# Patient Record
Sex: Female | Born: 1955 | Race: White | Hispanic: No | Marital: Single | Smoking: Never smoker
Health system: Southern US, Community
[De-identification: ages and names within clinical notes are randomized; demographics above are authoritative.]

## PROBLEM LIST (undated history)

## (undated) DIAGNOSIS — M79604 Pain in right leg: Secondary | ICD-10-CM

## (undated) DIAGNOSIS — M797 Fibromyalgia: Secondary | ICD-10-CM

## (undated) DIAGNOSIS — M1990 Unspecified osteoarthritis, unspecified site: Secondary | ICD-10-CM

## (undated) DIAGNOSIS — K760 Fatty (change of) liver, not elsewhere classified: Secondary | ICD-10-CM

## (undated) DIAGNOSIS — M5030 Other cervical disc degeneration, unspecified cervical region: Secondary | ICD-10-CM

## (undated) DIAGNOSIS — G4730 Sleep apnea, unspecified: Secondary | ICD-10-CM

## (undated) DIAGNOSIS — F32A Depression, unspecified: Secondary | ICD-10-CM

## (undated) DIAGNOSIS — M79605 Pain in left leg: Secondary | ICD-10-CM

## (undated) DIAGNOSIS — I10 Essential (primary) hypertension: Secondary | ICD-10-CM

## (undated) DIAGNOSIS — C801 Malignant (primary) neoplasm, unspecified: Secondary | ICD-10-CM

## (undated) DIAGNOSIS — E119 Type 2 diabetes mellitus without complications: Secondary | ICD-10-CM

## (undated) DIAGNOSIS — F419 Anxiety disorder, unspecified: Secondary | ICD-10-CM

## (undated) DIAGNOSIS — G2581 Restless legs syndrome: Secondary | ICD-10-CM

## (undated) DIAGNOSIS — K7581 Nonalcoholic steatohepatitis (NASH): Secondary | ICD-10-CM

## (undated) DIAGNOSIS — F329 Major depressive disorder, single episode, unspecified: Secondary | ICD-10-CM

## (undated) DIAGNOSIS — K589 Irritable bowel syndrome without diarrhea: Secondary | ICD-10-CM

## (undated) DIAGNOSIS — K219 Gastro-esophageal reflux disease without esophagitis: Secondary | ICD-10-CM

## (undated) DIAGNOSIS — M5136 Other intervertebral disc degeneration, lumbar region: Secondary | ICD-10-CM

## (undated) HISTORY — PX: JOINT REPLACEMENT: SHX530

## (undated) HISTORY — PX: CHOLECYSTECTOMY: SHX55

## (undated) HISTORY — PX: HEMORRHOID SURGERY: SHX153

## (undated) HISTORY — PX: TUBAL LIGATION: SHX77

---

## 2015-09-03 ENCOUNTER — Encounter
Admission: RE | Admit: 2015-09-03 | Discharge: 2015-09-03 | Disposition: A | Discharge status: Home / Self Care | Payer: BLUE CROSS/BLUE SHIELD | Source: Ambulatory Visit | Attending: Orthopedic Surgery | Admitting: Orthopedic Surgery

## 2015-09-03 DIAGNOSIS — M1611 Unilateral primary osteoarthritis, right hip: Secondary | ICD-10-CM | POA: Insufficient documentation

## 2015-09-03 DIAGNOSIS — Z01818 Encounter for other preprocedural examination: Secondary | ICD-10-CM | POA: Insufficient documentation

## 2015-09-03 HISTORY — DX: Depression, unspecified: F32.A

## 2015-09-03 HISTORY — DX: Irritable bowel syndrome without diarrhea: K58.9

## 2015-09-03 HISTORY — DX: Other intervertebral disc degeneration, lumbar region: M51.36

## 2015-09-03 HISTORY — DX: Other cervical disc degeneration, unspecified cervical region: M50.30

## 2015-09-03 HISTORY — DX: Unspecified osteoarthritis, unspecified site: M19.90

## 2015-09-03 HISTORY — DX: Restless legs syndrome: G25.81

## 2015-09-03 HISTORY — DX: Anxiety disorder, unspecified: F41.9

## 2015-09-03 HISTORY — DX: Essential (primary) hypertension: I10

## 2015-09-03 HISTORY — DX: Malignant (primary) neoplasm, unspecified: C80.1

## 2015-09-03 HISTORY — DX: Fatty (change of) liver, not elsewhere classified: K76.0

## 2015-09-03 HISTORY — DX: Major depressive disorder, single episode, unspecified: F32.9

## 2015-09-03 HISTORY — DX: Nonalcoholic steatohepatitis (NASH): K75.81

## 2015-09-03 HISTORY — DX: Sleep apnea, unspecified: G47.30

## 2015-09-03 HISTORY — DX: Pain in left leg: M79.605

## 2015-09-03 HISTORY — DX: Type 2 diabetes mellitus without complications: E11.9

## 2015-09-03 HISTORY — DX: Gastro-esophageal reflux disease without esophagitis: K21.9

## 2015-09-03 HISTORY — DX: Fibromyalgia: M79.7

## 2015-09-03 HISTORY — DX: Pain in right leg: M79.604

## 2015-09-03 LAB — APTT: APTT: 24 s (ref 24–36)

## 2015-09-03 LAB — CBC
HCT: 38.2 % (ref 35.0–47.0)
Hemoglobin: 13 g/dL (ref 12.0–16.0)
MCH: 30.4 pg (ref 26.0–34.0)
MCHC: 34 g/dL (ref 32.0–36.0)
MCV: 89.5 fL (ref 80.0–100.0)
PLATELETS: 205 10*3/uL (ref 150–440)
RBC: 4.27 MIL/uL (ref 3.80–5.20)
RDW: 13.6 % (ref 11.5–14.5)
WBC: 9.6 10*3/uL (ref 3.6–11.0)

## 2015-09-03 LAB — TYPE AND SCREEN
ABO/RH(D): A POS
ANTIBODY SCREEN: NEGATIVE

## 2015-09-03 LAB — URINALYSIS COMPLETE WITH MICROSCOPIC (ARMC ONLY)
GLUCOSE, UA: NEGATIVE mg/dL
HGB URINE DIPSTICK: NEGATIVE
Nitrite: NEGATIVE
PH: 5 (ref 5.0–8.0)
Protein, ur: 30 mg/dL — AB
SPECIFIC GRAVITY, URINE: 1.028 (ref 1.005–1.030)

## 2015-09-03 LAB — BASIC METABOLIC PANEL
ANION GAP: 10 (ref 5–15)
BUN: 19 mg/dL (ref 6–20)
CALCIUM: 9.5 mg/dL (ref 8.9–10.3)
CO2: 28 mmol/L (ref 22–32)
Chloride: 100 mmol/L — ABNORMAL LOW (ref 101–111)
Creatinine, Ser: 1.09 mg/dL — ABNORMAL HIGH (ref 0.44–1.00)
GFR calc Af Amer: >60 mL/min (ref >60)
GFR, EST NON AFRICAN AMERICAN: 54 mL/min — AB (ref >60)
GLUCOSE: 128 mg/dL — AB (ref 65–99)
POTASSIUM: 4.2 mmol/L (ref 3.5–5.1)
SODIUM: 138 mmol/L (ref 135–145)

## 2015-09-03 LAB — SURGICAL PCR SCREEN
MRSA, PCR: NEGATIVE
STAPHYLOCOCCUS AUREUS: POSITIVE — AB

## 2015-09-03 LAB — ABO/RH: ABO/RH(D): A POS

## 2015-09-03 LAB — PROTIME-INR
INR: 0.96
PROTHROMBIN TIME: 13 s (ref 11.4–15.0)

## 2015-09-03 LAB — SEDIMENTATION RATE: SED RATE: 25 mm/h (ref 0–30)

## 2015-09-03 NOTE — Patient Instructions (Signed)
  Your procedure is scheduled on: 09/09/15 Report to Day Surgery. [facility] To find out your arrival time please call [phone] between 1PM - 3PM on 09/08/15 Remember: Instructions that are not followed completely may result in serious medical risk, up to and including death, or upon the discretion of your surgeon and anesthesiologist your surgery may need to be rescheduled.    __X__ 1. Do not eat food or drink liquids after midnight. No gum chewing or hard candies.     __X__ 2. No Alcohol for 24 hours before or after surgery.   ____ 3. Bring all medications with you on the day of surgery if instructed.    _X___ 4. Notify your doctor if there is any change in your medical condition     (cold, fever, infections).     Do not wear jewelry, make-up, hairpins, clips or nail polish.  Do not wear lotions, powders, or perfumes. You may wear deodorant.  Do not shave 48 hours prior to surgery. Men may shave face and neck.  Do not bring valuables to the hospital.    [facility] is not responsible for any belongings or valuables.               Contacts, dentures or bridgework may not be worn into surgery.  Leave your suitcase in the car. After surgery it may be brought to your room.  For patients admitted to the hospital, discharge time is determined by your                treatment team.   Patients discharged the day of surgery will not be allowed to drive home.   Please read over the following fact sheets that you were given:   Surgical Site Infection Prevention   ____ Take these medicines the morning of surgery with A SIP OF WATER:    1. OMEPRAZOLE  2. CYMBALTA  3. METOPROLOL  4.  5.  6.  ____ Fleet Enema (as directed)   __X__ Use CHG Soap as directed  ____ Use inhalers on the day of surgery  __X__ Stop metformin 2 days prior to surgery    ____ Take 1/2 of usual insulin dose the night before surgery and none on the morning of surgery.   ____ Stop  Coumadin/Plavix/aspirin on STOP ASPIRIN 1 WEEK BEFORE SURGERY  __X__ Stop Anti-inflammatories on STOP VOLTAREN 1 WEEK BEFORE SURGERY  ____ Stop supplements until after surgery.    ____ Bring C-Pap to the hospital.

## 2015-09-05 LAB — URINE CULTURE: SPECIAL REQUESTS: NORMAL

## 2015-09-09 ENCOUNTER — Inpatient Hospital Stay: Payer: BLUE CROSS/BLUE SHIELD

## 2015-09-09 ENCOUNTER — Inpatient Hospital Stay
Admission: RE | Admit: 2015-09-09 | Discharge: 2015-09-12 | DRG: 470 | Disposition: A | Discharge status: Home Health | Payer: BLUE CROSS/BLUE SHIELD | Source: Ambulatory Visit | Attending: Orthopedic Surgery | Admitting: Orthopedic Surgery

## 2015-09-09 ENCOUNTER — Inpatient Hospital Stay: Payer: BLUE CROSS/BLUE SHIELD | Admitting: Anesthesiology

## 2015-09-09 ENCOUNTER — Encounter: Payer: Self-pay | Admitting: Orthopedic Surgery

## 2015-09-09 ENCOUNTER — Encounter
Admission: RE | Disposition: A | Discharge status: Home Health | Payer: Self-pay | Source: Ambulatory Visit | Attending: Orthopedic Surgery

## 2015-09-09 DIAGNOSIS — I10 Essential (primary) hypertension: Secondary | ICD-10-CM | POA: Diagnosis present

## 2015-09-09 DIAGNOSIS — G4730 Sleep apnea, unspecified: Secondary | ICD-10-CM | POA: Diagnosis present

## 2015-09-09 DIAGNOSIS — E119 Type 2 diabetes mellitus without complications: Secondary | ICD-10-CM | POA: Diagnosis present

## 2015-09-09 DIAGNOSIS — Z6841 Body Mass Index (BMI) 40.0 and over, adult: Secondary | ICD-10-CM

## 2015-09-09 DIAGNOSIS — Z86010 Personal history of colonic polyps: Secondary | ICD-10-CM | POA: Diagnosis not present

## 2015-09-09 DIAGNOSIS — M259 Joint disorder, unspecified: Secondary | ICD-10-CM

## 2015-09-09 DIAGNOSIS — G2581 Restless legs syndrome: Secondary | ICD-10-CM | POA: Diagnosis present

## 2015-09-09 DIAGNOSIS — K219 Gastro-esophageal reflux disease without esophagitis: Secondary | ICD-10-CM | POA: Diagnosis present

## 2015-09-09 DIAGNOSIS — M5136 Other intervertebral disc degeneration, lumbar region: Secondary | ICD-10-CM | POA: Diagnosis present

## 2015-09-09 DIAGNOSIS — K589 Irritable bowel syndrome without diarrhea: Secondary | ICD-10-CM | POA: Diagnosis present

## 2015-09-09 DIAGNOSIS — M797 Fibromyalgia: Secondary | ICD-10-CM | POA: Diagnosis present

## 2015-09-09 DIAGNOSIS — F329 Major depressive disorder, single episode, unspecified: Secondary | ICD-10-CM | POA: Diagnosis present

## 2015-09-09 DIAGNOSIS — M1611 Unilateral primary osteoarthritis, right hip: Principal | ICD-10-CM | POA: Diagnosis present

## 2015-09-09 DIAGNOSIS — D62 Acute posthemorrhagic anemia: Secondary | ICD-10-CM | POA: Diagnosis not present

## 2015-09-09 DIAGNOSIS — M5030 Other cervical disc degeneration, unspecified cervical region: Secondary | ICD-10-CM | POA: Diagnosis present

## 2015-09-09 DIAGNOSIS — G8918 Other acute postprocedural pain: Secondary | ICD-10-CM

## 2015-09-09 DIAGNOSIS — E669 Obesity, unspecified: Secondary | ICD-10-CM | POA: Diagnosis present

## 2015-09-09 DIAGNOSIS — F419 Anxiety disorder, unspecified: Secondary | ICD-10-CM | POA: Diagnosis present

## 2015-09-09 HISTORY — PX: TOTAL HIP ARTHROPLASTY: SHX124

## 2015-09-09 LAB — GLUCOSE, CAPILLARY
GLUCOSE-CAPILLARY: 144 mg/dL — AB (ref 65–99)
GLUCOSE-CAPILLARY: 138 mg/dL — AB (ref 65–99)
GLUCOSE-CAPILLARY: 156 mg/dL — AB (ref 65–99)
Glucose-Capillary: 123 mg/dL — ABNORMAL HIGH (ref 65–99)

## 2015-09-09 LAB — CBC
HEMATOCRIT: 39.2 % (ref 35.0–47.0)
HEMOGLOBIN: 12.7 g/dL (ref 12.0–16.0)
MCH: 29.7 pg (ref 26.0–34.0)
MCHC: 32.4 g/dL (ref 32.0–36.0)
MCV: 91.6 fL (ref 80.0–100.0)
Platelets: 185 10*3/uL (ref 150–440)
RBC: 4.28 MIL/uL (ref 3.80–5.20)
RDW: 13.7 % (ref 11.5–14.5)
WBC: 14.2 10*3/uL — ABNORMAL HIGH (ref 3.6–11.0)

## 2015-09-09 LAB — CREATININE, SERUM
CREATININE: 0.92 mg/dL (ref 0.44–1.00)
GFR calc Af Amer: >60 mL/min (ref >60)
GFR calc non Af Amer: >60 mL/min (ref >60)

## 2015-09-09 SURGERY — ARTHROPLASTY, HIP, TOTAL, ANTERIOR APPROACH
Anesthesia: Spinal | Laterality: Right

## 2015-09-09 MED ORDER — MENTHOL 3 MG MT LOZG: 1.0000 | LOZENGE | OROMUCOSAL | Status: DC | PRN

## 2015-09-09 MED ORDER — ONDANSETRON HCL 4 MG/2ML IJ SOLN: 4.0000 mg | Freq: Once | INTRAMUSCULAR | Status: DC | PRN

## 2015-09-09 MED ORDER — CEFAZOLIN SODIUM-DEXTROSE 2-3 GM-% IV SOLR
2.0000 g | Freq: Four times a day (QID) | INTRAVENOUS | Status: AC
  Administered 2015-09-09 – 2015-09-10 (×3): 2 g via INTRAVENOUS
  Filled 2015-09-09 (×3): qty 50

## 2015-09-09 MED ORDER — MAGNESIUM HYDROXIDE 400 MG/5ML PO SUSP
30.0000 mL | Freq: Every day | ORAL | Status: DC | PRN
  Administered 2015-09-10 – 2015-09-11 (×3): 30 mL via ORAL
  Filled 2015-09-09 (×4): qty 30

## 2015-09-09 MED ORDER — INSULIN ASPART 100 UNIT/ML ~~LOC~~ SOLN
0.0000 [IU] | Freq: Three times a day (TID) | SUBCUTANEOUS | Status: DC
  Administered 2015-09-09: 2 [IU] via SUBCUTANEOUS
  Administered 2015-09-10 – 2015-09-12 (×8): 3 [IU] via SUBCUTANEOUS
  Filled 2015-09-09 (×5): qty 3
  Filled 2015-09-09: qty 2
  Filled 2015-09-09 (×3): qty 3

## 2015-09-09 MED ORDER — FENTANYL CITRATE (PF) 100 MCG/2ML IJ SOLN: 25.0000 ug | INTRAMUSCULAR | Status: DC | PRN

## 2015-09-09 MED ORDER — LISINOPRIL 20 MG PO TABS
20.0000 mg | ORAL_TABLET | Freq: Every day | ORAL | Status: DC
  Administered 2015-09-10 – 2015-09-12 (×3): 20 mg via ORAL
  Filled 2015-09-09 (×3): qty 1

## 2015-09-09 MED ORDER — MIDAZOLAM HCL 5 MG/5ML IJ SOLN
INTRAMUSCULAR | Status: DC | PRN
  Administered 2015-09-09: 1 mg via INTRAVENOUS

## 2015-09-09 MED ORDER — TRANEXAMIC ACID 1000 MG/10ML IV SOLN
1500.0000 mg | INTRAVENOUS | Status: DC
  Filled 2015-09-09: qty 15

## 2015-09-09 MED ORDER — METHOCARBAMOL 500 MG PO TABS
500.0000 mg | ORAL_TABLET | Freq: Four times a day (QID) | ORAL | Status: DC | PRN
  Administered 2015-09-09 – 2015-09-12 (×2): 500 mg via ORAL
  Filled 2015-09-09 (×2): qty 1

## 2015-09-09 MED ORDER — ONDANSETRON HCL 4 MG/2ML IJ SOLN: 4.0000 mg | Freq: Four times a day (QID) | INTRAMUSCULAR | Status: DC | PRN

## 2015-09-09 MED ORDER — FENTANYL CITRATE (PF) 100 MCG/2ML IJ SOLN
INTRAMUSCULAR | Status: DC | PRN
  Administered 2015-09-09: 50 ug via INTRAVENOUS

## 2015-09-09 MED ORDER — CHLORHEXIDINE GLUCONATE 4 % EX LIQD
60.0000 mL | Freq: Once | CUTANEOUS | Status: DC
Start: 2015-09-09 — End: 2015-09-09

## 2015-09-09 MED ORDER — CEFAZOLIN SODIUM-DEXTROSE 2-3 GM-% IV SOLR
INTRAVENOUS | Status: AC
Start: 2015-09-09 — End: 2015-09-09
  Administered 2015-09-09: 2 g via INTRAVENOUS
  Filled 2015-09-09: qty 50

## 2015-09-09 MED ORDER — OXYCODONE HCL 5 MG PO TABS
5.0000 mg | ORAL_TABLET | ORAL | Status: DC | PRN
  Administered 2015-09-09 (×2): 5 mg via ORAL
  Administered 2015-09-10 (×3): 10 mg via ORAL
  Administered 2015-09-10 – 2015-09-12 (×7): 5 mg via ORAL
  Filled 2015-09-09 (×4): qty 1
  Filled 2015-09-09: qty 2
  Filled 2015-09-09 (×2): qty 1
  Filled 2015-09-09: qty 2
  Filled 2015-09-09 (×3): qty 1
  Filled 2015-09-09: qty 2

## 2015-09-09 MED ORDER — PROPOFOL 500 MG/50ML IV EMUL
INTRAVENOUS | Status: DC | PRN
  Administered 2015-09-09: 75 ug/kg/min via INTRAVENOUS

## 2015-09-09 MED ORDER — BUPIVACAINE-EPINEPHRINE (PF) 0.25% -1:200000 IJ SOLN
INTRAMUSCULAR | Status: AC
  Filled 2015-09-09: qty 30

## 2015-09-09 MED ORDER — PROPOFOL 10 MG/ML IV BOLUS
INTRAVENOUS | Status: DC | PRN
  Administered 2015-09-09: 20 mg via INTRAVENOUS

## 2015-09-09 MED ORDER — KETAMINE HCL 50 MG/ML IJ SOLN
INTRAMUSCULAR | Status: DC | PRN
  Administered 2015-09-09: 50 mg via INTRAMUSCULAR

## 2015-09-09 MED ORDER — PHENYLEPHRINE HCL 10 MG/ML IJ SOLN
INTRAMUSCULAR | Status: DC | PRN
  Administered 2015-09-09 (×2): 100 ug via INTRAVENOUS
  Administered 2015-09-09 (×2): 200 ug via INTRAVENOUS
  Administered 2015-09-09 (×3): 100 ug via INTRAVENOUS
  Administered 2015-09-09: 200 ug via INTRAVENOUS
  Administered 2015-09-09: 100 ug via INTRAVENOUS
  Administered 2015-09-09: 200 ug via INTRAVENOUS
  Administered 2015-09-09: 100 ug via INTRAVENOUS

## 2015-09-09 MED ORDER — DULOXETINE HCL 60 MG PO CPEP
60.0000 mg | ORAL_CAPSULE | Freq: Two times a day (BID) | ORAL | Status: DC
  Administered 2015-09-09 – 2015-09-12 (×7): 60 mg via ORAL
  Filled 2015-09-09 (×7): qty 1

## 2015-09-09 MED ORDER — TRANEXAMIC ACID 1000 MG/10ML IV SOLN
15.0000 mg | INTRAVENOUS | Status: DC | PRN
  Administered 2015-09-09: 1500 mg via INTRAVENOUS

## 2015-09-09 MED ORDER — ALPRAZOLAM 0.5 MG PO TABS
1.0000 mg | ORAL_TABLET | Freq: Three times a day (TID) | ORAL | Status: DC | PRN
  Administered 2015-09-09 – 2015-09-11 (×2): 1 mg via ORAL
  Filled 2015-09-09 (×2): qty 2

## 2015-09-09 MED ORDER — PANTOPRAZOLE SODIUM 40 MG PO TBEC
40.0000 mg | DELAYED_RELEASE_TABLET | Freq: Every day | ORAL | Status: DC
  Administered 2015-09-09 – 2015-09-12 (×4): 40 mg via ORAL
  Filled 2015-09-09 (×4): qty 1

## 2015-09-09 MED ORDER — ENOXAPARIN SODIUM 40 MG/0.4ML ~~LOC~~ SOLN
40.0000 mg | SUBCUTANEOUS | Status: DC
  Administered 2015-09-10 – 2015-09-12 (×3): 40 mg via SUBCUTANEOUS
  Filled 2015-09-09 (×3): qty 0.4

## 2015-09-09 MED ORDER — ASPIRIN 81 MG PO CHEW
81.0000 mg | CHEWABLE_TABLET | Freq: Every day | ORAL | Status: DC
  Administered 2015-09-09 – 2015-09-12 (×4): 81 mg via ORAL
  Filled 2015-09-09 (×5): qty 1

## 2015-09-09 MED ORDER — MORPHINE SULFATE (PF) 2 MG/ML IV SOLN
INTRAVENOUS | Status: AC
  Filled 2015-09-09: qty 1

## 2015-09-09 MED ORDER — DEXTROSE 5 % IV SOLN
500.0000 mg | Freq: Four times a day (QID) | INTRAVENOUS | Status: DC | PRN
  Filled 2015-09-09: qty 5

## 2015-09-09 MED ORDER — METOCLOPRAMIDE HCL 5 MG/ML IJ SOLN: 5.0000 mg | Freq: Three times a day (TID) | INTRAMUSCULAR | Status: DC | PRN

## 2015-09-09 MED ORDER — SODIUM CHLORIDE 0.9 % IJ SOLN
INTRAMUSCULAR | Status: AC
  Filled 2015-09-09: qty 10

## 2015-09-09 MED ORDER — NEOMYCIN-POLYMYXIN B GU 40-200000 IR SOLN
Status: AC
  Filled 2015-09-09: qty 4

## 2015-09-09 MED ORDER — DIPHENHYDRAMINE HCL 12.5 MG/5ML PO ELIX: 12.5000 mg | ORAL_SOLUTION | ORAL | Status: DC | PRN

## 2015-09-09 MED ORDER — DOCUSATE SODIUM 100 MG PO CAPS
100.0000 mg | ORAL_CAPSULE | Freq: Two times a day (BID) | ORAL | Status: DC
  Administered 2015-09-09 – 2015-09-12 (×7): 100 mg via ORAL
  Filled 2015-09-09 (×7): qty 1

## 2015-09-09 MED ORDER — SODIUM CHLORIDE 0.9 % IV SOLN
INTRAVENOUS | Status: DC
  Administered 2015-09-09 – 2015-09-10 (×2): via INTRAVENOUS

## 2015-09-09 MED ORDER — SODIUM CHLORIDE 0.9 % IV SOLN
INTRAVENOUS | Status: DC
  Administered 2015-09-09 (×3): via INTRAVENOUS

## 2015-09-09 MED ORDER — EPHEDRINE SULFATE 50 MG/ML IJ SOLN
INTRAMUSCULAR | Status: AC
  Administered 2015-09-09: 5 mg via INTRAVENOUS
  Filled 2015-09-09: qty 1

## 2015-09-09 MED ORDER — ONDANSETRON HCL 4 MG PO TABS
4.0000 mg | ORAL_TABLET | Freq: Four times a day (QID) | ORAL | Status: DC | PRN
  Administered 2015-09-11: 4 mg via ORAL
  Filled 2015-09-09: qty 1

## 2015-09-09 MED ORDER — LISINOPRIL-HYDROCHLOROTHIAZIDE 20-12.5 MG PO TABS: 1.0000 | ORAL_TABLET | Freq: Every day | ORAL | Status: DC

## 2015-09-09 MED ORDER — MAGNESIUM CITRATE PO SOLN: 1.0000 | Freq: Once | ORAL | Status: DC | PRN

## 2015-09-09 MED ORDER — BUPIVACAINE HCL (PF) 0.5 % IJ SOLN
INTRAMUSCULAR | Status: DC | PRN
  Administered 2015-09-09: 3 mL

## 2015-09-09 MED ORDER — BISACODYL 10 MG RE SUPP
10.0000 mg | Freq: Every day | RECTAL | Status: DC | PRN
  Administered 2015-09-12: 10 mg via RECTAL
  Filled 2015-09-09: qty 1

## 2015-09-09 MED ORDER — METFORMIN HCL 500 MG PO TABS
500.0000 mg | ORAL_TABLET | Freq: Two times a day (BID) | ORAL | Status: DC
  Administered 2015-09-09 – 2015-09-12 (×6): 500 mg via ORAL
  Filled 2015-09-09 (×6): qty 1

## 2015-09-09 MED ORDER — CEFAZOLIN SODIUM-DEXTROSE 2-3 GM-% IV SOLR: 2.0000 g | INTRAVENOUS | Status: DC

## 2015-09-09 MED ORDER — METOPROLOL TARTRATE 50 MG PO TABS
50.0000 mg | ORAL_TABLET | Freq: Every morning | ORAL | Status: DC
  Administered 2015-09-10 – 2015-09-12 (×3): 50 mg via ORAL
  Filled 2015-09-09 (×3): qty 1

## 2015-09-09 MED ORDER — ZOLPIDEM TARTRATE 5 MG PO TABS
5.0000 mg | ORAL_TABLET | Freq: Every evening | ORAL | Status: DC | PRN
  Filled 2015-09-09: qty 1

## 2015-09-09 MED ORDER — PHENOL 1.4 % MT LIQD: 1.0000 | OROMUCOSAL | Status: DC | PRN

## 2015-09-09 MED ORDER — ACETAMINOPHEN 325 MG PO TABS: 650.0000 mg | ORAL_TABLET | Freq: Four times a day (QID) | ORAL | Status: DC | PRN

## 2015-09-09 MED ORDER — EPHEDRINE SULFATE 50 MG/ML IJ SOLN
5.0000 mg | Freq: Once | INTRAMUSCULAR | Status: AC
  Administered 2015-09-09: 5 mg via INTRAVENOUS

## 2015-09-09 MED ORDER — HYDROCHLOROTHIAZIDE 12.5 MG PO CAPS
12.5000 mg | ORAL_CAPSULE | Freq: Every day | ORAL | Status: DC
  Administered 2015-09-09 – 2015-09-12 (×4): 12.5 mg via ORAL
  Filled 2015-09-09 (×4): qty 1

## 2015-09-09 MED ORDER — MORPHINE SULFATE (PF) 2 MG/ML IV SOLN
2.0000 mg | INTRAVENOUS | Status: DC | PRN
  Administered 2015-09-09 – 2015-09-10 (×5): 2 mg via INTRAVENOUS
  Filled 2015-09-09 (×4): qty 1

## 2015-09-09 MED ORDER — SODIUM CHLORIDE 0.9 % IV SOLN
INTRAVENOUS | Status: DC
  Administered 2015-09-09: 11:00:00 via INTRAVENOUS

## 2015-09-09 MED ORDER — BUPIVACAINE-EPINEPHRINE 0.25% -1:200000 IJ SOLN
INTRAMUSCULAR | Status: DC | PRN
  Administered 2015-09-09: 30 mL

## 2015-09-09 MED ORDER — METOCLOPRAMIDE HCL 5 MG PO TABS
5.0000 mg | ORAL_TABLET | Freq: Three times a day (TID) | ORAL | Status: DC | PRN
Start: 2015-09-09 — End: 2015-09-12

## 2015-09-09 MED ORDER — ACETAMINOPHEN 650 MG RE SUPP: 650.0000 mg | Freq: Four times a day (QID) | RECTAL | Status: DC | PRN

## 2015-09-09 SURGICAL SUPPLY — 44 items
BLADE SAW 1/2 (BLADE) ×3 IMPLANT
BNDG COHESIVE 6X5 TAN STRL LF (GAUZE/BANDAGES/DRESSINGS) ×6 IMPLANT
CANISTER SUCT 1200ML W/VALVE (MISCELLANEOUS) ×3 IMPLANT
CAPT HIP TOTAL 3 ×3 IMPLANT
CATH FOL LEG HOLDER (MISCELLANEOUS) ×3 IMPLANT
CATH TRAY METER 16FR LF (MISCELLANEOUS) ×3 IMPLANT
CHLORAPREP W/TINT 26ML (MISCELLANEOUS) ×3 IMPLANT
DRAPE C-ARM XRAY 36X54 (DRAPES) ×3 IMPLANT
DRAPE INCISE IOBAN 66X60 STRL (DRAPES) IMPLANT
DRAPE POUCH INSTRU U-SHP 10X18 (DRAPES) ×3 IMPLANT
DRAPE SHEET LG 3/4 BI-LAMINATE (DRAPES) ×9 IMPLANT
DRAPE TABLE BACK 80X90 (DRAPES) ×3 IMPLANT
ELECT BLADE 6.5 EXT (BLADE) ×3 IMPLANT
GAUZE SPONGE 4X4 12PLY STRL (GAUZE/BANDAGES/DRESSINGS) ×3 IMPLANT
GLOVE BIOGEL PI IND STRL 9 (GLOVE) ×1 IMPLANT
GLOVE BIOGEL PI INDICATOR 9 (GLOVE) ×2
GLOVE SURG ORTHO 9.0 STRL STRW (GLOVE) ×3 IMPLANT
GOWN SPECIALTY ULTRA XL (MISCELLANEOUS) ×3 IMPLANT
GOWN STRL REUS W/ TWL LRG LVL3 (GOWN DISPOSABLE) ×1 IMPLANT
GOWN STRL REUS W/TWL LRG LVL3 (GOWN DISPOSABLE) ×2
HEMOVAC 400CC 10FR (MISCELLANEOUS) ×3 IMPLANT
HOOD PEEL AWAY FACE SHEILD DIS (HOOD) ×3 IMPLANT
KIT PREVENA INCISION MGT20CM45 (CANNISTER) ×3 IMPLANT
MAT BLUE FLOOR 46X72 FLO (MISCELLANEOUS) ×3 IMPLANT
NDL SAFETY 18GX1.5 (NEEDLE) ×3 IMPLANT
NEEDLE SPNL 18GX3.5 QUINCKE PK (NEEDLE) ×3 IMPLANT
NS IRRIG 1000ML POUR BTL (IV SOLUTION) ×3 IMPLANT
PACK HIP COMPR (MISCELLANEOUS) ×3 IMPLANT
SOL PREP PVP 2OZ (MISCELLANEOUS) ×3
SOLUTION PREP PVP 2OZ (MISCELLANEOUS) ×1 IMPLANT
STAPLER SKIN PROX 35W (STAPLE) ×3 IMPLANT
STRAP SAFETY BODY (MISCELLANEOUS) ×3 IMPLANT
SUT DVC 2 QUILL PDO  T11 36X36 (SUTURE) ×2
SUT DVC 2 QUILL PDO T11 36X36 (SUTURE) ×1 IMPLANT
SUT DVC QUILL MONODERM 30X30 (SUTURE) ×3 IMPLANT
SUT ETHIBOND NAB CT1 #1 30IN (SUTURE) ×3 IMPLANT
SUT SILK 0 (SUTURE) ×2
SUT SILK 0 30XBRD TIE 6 (SUTURE) ×1 IMPLANT
SUT VIC AB 1 CT1 36 (SUTURE) ×3 IMPLANT
SYR 20CC LL (SYRINGE) ×3 IMPLANT
SYR 30ML LL (SYRINGE) ×3 IMPLANT
TAPE MICROFOAM 4IN (TAPE) ×3 IMPLANT
TUBE KAMVAC SUCTION (TUBING) ×3 IMPLANT
WATER STERILE IRR 1000ML POUR (IV SOLUTION) ×3 IMPLANT

## 2015-09-09 NOTE — Progress Notes (Signed)
Neuro checks wnl to rle. Pain relief with present regime. Tolerated oob to chair for 2 hrs. Right hip dressing intact  With provena chg. Minimal drainage

## 2015-09-09 NOTE — Anesthesia Procedure Notes (Addendum)
Spinal Patient location during procedure: OR Start time: 09/09/2015 7:20 AM End time: 09/09/2015 7:25 AM Staffing Resident/CRNA: Nelda Marseille Performed by: resident/CRNA  Preanesthetic Checklist Completed: patient identified, site marked, surgical consent, pre-op evaluation, timeout performed, IV checked, risks and benefits discussed and monitors and equipment checked Spinal Block Patient position: sitting Prep: Betadine Patient monitoring: heart rate, continuous pulse ox, blood pressure and cardiac monitor Approach: midline Location: L4-5 Injection technique: single-shot Needle Needle type: Whitacre and Introducer  Needle gauge: 24 G Needle length: 9 cm Assessment Sensory level: T10 Additional Notes Negative paresthesia. Negative blood return. Positive free-flowing CSF. Expiration date of kit checked and confirmed. Patient tolerated procedure well, without complications.    Date/Time: 09/09/2015 8:07 AM Performed by: Nelda Marseille Pre-anesthesia Checklist: Patient identified, Emergency Drugs available, Suction available, Patient being monitored and Timeout performed Patient Re-evaluated:Patient Re-evaluated prior to inductionOxygen Delivery Method: Simple face mask

## 2015-09-09 NOTE — OR Nursing (Signed)
Dr Ronelle Nigh aware of low bp 84 systolic and low u/o of 50 ml throughout case.  500 ml NS bolus and 5 mg Ephendrine given as oredered.

## 2015-09-09 NOTE — Transfer of Care (Signed)
Immediate Anesthesia Transfer of Care Note  Patient: Omelia Blackwater  Procedure(s) Performed: Procedure(s): TOTAL HIP ARTHROPLASTY ANTERIOR APPROACH (Right)  Patient Location: PACU  Anesthesia Type:Spinal  Level of Consciousness: sedated  Airway & Oxygen Therapy: Patient Spontanous Breathing and Patient connected to face mask oxygen  Post-op Assessment: Report given to RN and Post -op Vital signs reviewed and stable  Post vital signs: Reviewed and stable  Last Vitals:  Filed Vitals:   09/09/15 0644  BP: 114/77  Pulse: 73  Temp: 36.8 C  Resp: 16    Complications: No apparent anesthesia complications

## 2015-09-09 NOTE — Care Management Note (Addendum)
Case Management Note  Patient Details  Name: Erin Wiley MRN: [id number] Date of Birth: 11/14/1955  Subjective/Objective:                  Met with patient and one of her daughter's Erin Wiley [phone] (cell), [phone] (work 8a-5p) to discuss discharge planning. Patient received from PACU and PT is currently working with her. Patient will be going to Erin Wiley's address: [address] [place] [address] at discharge. She will need a rolling walker. She would like to use [facility] home health [phone] but they are not in-network with [employer]. [facility] however is in -network and if her insurance approve. s they can start PT on Monday. [facility] referral called to [name] [phone] and faxed demographics to [phone] (fax).   Action/Plan: Patient provided choice for home health agencies. Rolling walker requested from [place].She uses [facility] for Rx [phone]. Lovenox 87m #14 called in [facility] for price and availability.   Expected Discharge Date:                  Expected Discharge Plan:     In-House Referral:     Discharge planning Services  CM Consult  Post Acute Care Choice:  Durable Medical Equipment, Home Health Choice offered to:  Patient, Adult Children  DME Arranged:  Walker rolling DME Agency:     [facility] Status of Service:  In process, will continue to follow  Medicare Important Message Given:    Date Medicare IM Given:    Medicare IM give by:    Date Additional Medicare IM Given:    Additional Medicare Important Message give by:     If discussed at [place] Meetings, dates discussed:    Additional Comments: Lovenox $28.16.   AMarshell Garfinkel RN 09/09/2015, 3:13 PM

## 2015-09-09 NOTE — Anesthesia Preprocedure Evaluation (Signed)
Anesthesia Evaluation  Patient identified by MRN, date of birth, ID band Patient awake    Reviewed: Allergy & Precautions, NPO status , Patient's Chart, lab work & pertinent test results  History of Anesthesia Complications Negative for: history of anesthetic complications  Airway Mallampati: III       Dental  (+) Upper Dentures, Missing   Pulmonary neg pulmonary ROS, sleep apnea ,           Cardiovascular hypertension, Pt. on medications and Pt. on home beta blockers      Neuro/Psych Anxiety Depression    GI/Hepatic GERD  Medicated and Controlled,(+) Hepatitis - (fatty liver)  Endo/Other  diabetes, Type 2  Renal/GU negative Renal ROS     Musculoskeletal  (+) Arthritis , Osteoarthritis,  Fibromyalgia -  Abdominal   Peds  Hematology negative hematology ROS (+)   Anesthesia Other Findings   Reproductive/Obstetrics                             Anesthesia Physical Anesthesia Plan  ASA: III  Anesthesia Plan: Spinal   Post-op Pain Management:    Induction:   Airway Management Planned:   Additional Equipment:   Intra-op Plan:   Post-operative Plan:   Informed Consent: I have reviewed the patients History and Physical, chart, labs and discussed the procedure including the risks, benefits and alternatives for the proposed anesthesia with the patient or authorized representative who has indicated his/her understanding and acceptance.     Plan Discussed with:   Anesthesia Plan Comments:         Anesthesia Quick Evaluation

## 2015-09-09 NOTE — Op Note (Signed)
09/09/2015  10:00 AM  PATIENT:  Erin Wiley  59 y.o. female  PRE-OPERATIVE DIAGNOSIS:  OSTEOARTHRITIS right hip primary osteoarthritis  POST-OPERATIVE DIAGNOSIS:  Same  PROCEDURE:  Procedure(s): TOTAL HIP ARTHROPLASTY ANTERIOR APPROACH (Right)  SURGEON: Laurene Footman, MD  ASSISTANTS: None  ANESTHESIA:   spinal  EBL:  Total I/O In: 1000 [I.V.:1000] Out: 350 [Urine:50; Blood:300]  BLOOD ADMINISTERED:none  DRAINS: none   LOCAL MEDICATIONS USED:  MARCAINE     SPECIMEN:  Source of Specimen:  Right femoral head  DISPOSITION OF SPECIMEN:  PATHOLOGY  COUNTS:  YES  TOURNIQUET:  * No tourniquets in log *  IMPLANTS: Medacta AMIS 5 collared stem, 52 mm  mpact cup DM with liner and S 28 mm head   DICTATION: .Dragon Dictation   The patient was brought to the operating room and after spinal anesthesia was obpatient was placed on the operative table with the ipsilateral foot into the Medacta attachment, contralateral leg on a well-padded table. C-arm was brought in and preop template x-ray taken. After prepping and draping in usual sterile fashion appropriate patient identification and timeout procedures were completed. Anterior approach to the hip was obtained and centered over the greater trochanter and TFL muscle. The subcutaneous tissue was incised hemostasis being achieved by electrocautery. TFL fascia was incised and the muscle retracted laterally deep retractor placed. The lateral femoral circumflex vessels were identified and ligated. The anterior capsule was exposed and a capsulotomy performed. The neck was identified and a femoral neck cut carried out with a saw. The head was removed without difficulty and showed sclerotic femoral head and acetabulum. Reaming was carried out to50 mm and a 52 mm cup trial gave appropriate tightness to the acetabular component a 52 mm mPACT cup was impacted into position. The leg was then externally rotated and ischiofemoral and patellofemoral  releases carried out. The femur was sequentially broached to a si5, si5 stem and S head trials were placed and the final components chosen. The 5 stem was inserted along with a a S 28 mm head an52 mm liner. The hip was reduced and was stable the wound was thoroughly irrigated with a dilute Betadine solution. The deep fascia view. Using a heavy Quill after infiltration of 30 cc of quarter percent Sensorcaine with epinephrine. 2-0 Quill to close the skin with skin st, with probing of wound VAC applied at the end of the case. There was a small fracture of the greater trochanter noted intraoperatively but it was felt to be stable a stable fracture pattern with good soft tissue attachments and so no wiring of this was required  PLAN OF CARE: Admit to inpatient

## 2015-09-09 NOTE — Evaluation (Signed)
Physical Therapy Evaluation Patient Details Name: Erin Wiley MRN: [id number] DOB: 1956/05/26 Today's Date: 09/09/2015   History of Present Illness  Pt underwent R THR anterior approach without reported post-op complications. Evaluation performed on POD#0. Pt reports full independent community ambulation prior to surgery without history of falls. No reported use of assistive device at baseline  Clinical Impression  Pt demonstrates good strength in RLE with bed mobility, transfers, and ambulation. She requires some assist for safe sequencing but gradually improves throughout session. Pt is able to transfer from bed to recliner during evaluation. Pt able to complete all bed exercises as instructed requiring assistance for SLR on RLE. Pt will be appropriate to return home with [facility] PT at discharge. Pt will benefit from skilled PT services to address deficits in strength, balance, and mobility in order to return to full function at home.     Follow Up Recommendations Home health PT;Supervision - Intermittent    Equipment Recommendations  Rolling walker with 5" wheels    Recommendations for Other Services       Precautions / Restrictions Precautions Precautions: Anterior Hip Precaution Booklet Issued: Yes (comment) Restrictions Weight Bearing Restrictions: Yes RLE Weight Bearing: Weight bearing as tolerated      Mobility  Bed Mobility Overal bed mobility: Needs Assistance Bed Mobility: Supine to Sit     Supine to sit: Min assist     General bed mobility comments: Pt requires some assist for righting LEs at EOB and UE support to pull up from supine. Cues for sequencing  Transfers Overall transfer level: Needs assistance Equipment used: Rolling walker (2 wheeled) Transfers: Sit to/from Stand Sit to Stand: Min guard         General transfer comment: Pt requires cues for safe hand placement and proper sequencing. Decreased weight shift to RLE during transfer. Good LLE strength  noted. Pt steady in static standing once upright  Ambulation/Gait Ambulation/Gait assistance: Min guard Ambulation Distance (Feet): 5 Feet Assistive device: Rolling walker (2 wheeled) Gait Pattern/deviations: Step-to pattern;Decreased step length - left;Decreased stance time - right;Antalgic Gait velocity: Decreased Gait velocity interpretation: <1.8 ft/sec, indicative of risk for recurrent falls General Gait Details: Pt ambulates from bed to recliner with cues and instruction for proper sequencing for forward/retro ambulation. Pt improves throughout distance with increased ease of sequencing. Step-to pattern with progressively decreasing reliance on UE for support.  Stairs            Wheelchair Mobility    Modified Rankin (Stroke Patients Only)       Balance Overall balance assessment: Needs assistance   Sitting balance-Leahy Scale: Good       Standing balance-Leahy Scale: Fair                               Pertinent Vitals/Pain Pain Assessment: 0-10 Pain Score: 8  Pain Location: R hip Pain Intervention(s): Monitored during session;Premedicated before session;Repositioned    Home Living Family/patient expects to be discharged to:: Private residence Living Arrangements: Children (Will stay at daughter's home at discharge) Available Help at Discharge: Family Type of Home: House Home Access: Stairs to enter Entrance Stairs-Rails: Can reach both Entrance Stairs-Number of Steps: 3 Home Layout: One level Home Equipment: None (no BSC, no walker, no cane, no wheelchair, no hospital bed)      Prior Function Level of Independence: Independent               Hand Dominance  Dominant Hand: Right    Extremity/Trunk Assessment   Upper Extremity Assessment: Overall WFL for tasks assessed           Lower Extremity Assessment: RLE deficits/detail RLE Deficits / Details: Assist required for SLR. Pt with full SAQ without assist. Full DF/PF        Communication   Communication: No difficulties  Cognition Arousal/Alertness: Awake/alert Behavior During Therapy: WFL for tasks assessed/performed Overall Cognitive Status: Within Functional Limits for tasks assessed                      General Comments      Exercises Total Joint Exercises Ankle Circles/Pumps: Strengthening;Both;15 reps;Supine Quad Sets: Strengthening;Both;15 reps;Supine Gluteal Sets: Strengthening;Both;15 reps;Supine Towel Squeeze: Strengthening;Both;15 reps;Supine Short Arc Quad: Strengthening;Right;10 reps;Supine Hip ABduction/ADduction: Strengthening;Both;10 reps;Supine Straight Leg Raises: Strengthening;Right;10 reps;Supine      Assessment/Plan    PT Assessment Patient needs continued PT services  PT Diagnosis Difficulty walking;Abnormality of gait;Generalized weakness;Acute pain   PT Problem List Decreased strength;Decreased range of motion;Decreased activity tolerance;Decreased balance;Decreased mobility;Decreased knowledge of use of DME;Pain;Obesity  PT Treatment Interventions DME instruction;Gait training;Stair training;Functional mobility training;Therapeutic activities;Therapeutic exercise;Balance training;Neuromuscular re-education;Cognitive remediation;Patient/family education;Manual techniques   PT Goals (Current goals can be found in the Care Plan section) Acute Rehab PT Goals Patient Stated Goal: "I just really need to move." PT Goal Formulation: With patient/family Time For Goal Achievement: 09/23/15 Potential to Achieve Goals: Good    Frequency BID   Barriers to discharge        Co-evaluation               End of Session Equipment Utilized During Treatment: Gait belt Activity Tolerance: Patient tolerated treatment well Patient left: in chair;with call bell/phone within reach;with chair alarm set;with family/visitor present;with SCD's reapplied (RLE elevated)           Time: 2725-3664 PT Time Calculation (min)  (ACUTE ONLY): 28 min   Charges:   PT Evaluation $Initial PT Evaluation Tier I: 1 Procedure PT Treatments $Therapeutic Exercise: 8-22 mins   PT G Codes:       Lyndel Safe Davarious Tumbleson PT, DPT   Ellarie Picking 09/09/2015, 3:32 PM

## 2015-09-10 LAB — GLUCOSE, CAPILLARY
GLUCOSE-CAPILLARY: 165 mg/dL — AB (ref 65–99)
GLUCOSE-CAPILLARY: 194 mg/dL — AB (ref 65–99)
GLUCOSE-CAPILLARY: 176 mg/dL — AB (ref 65–99)
Glucose-Capillary: 176 mg/dL — ABNORMAL HIGH (ref 65–99)

## 2015-09-10 LAB — BASIC METABOLIC PANEL
Anion gap: 9 (ref 5–15)
BUN: 11 mg/dL (ref 6–20)
CALCIUM: 8.6 mg/dL — AB (ref 8.9–10.3)
CHLORIDE: 98 mmol/L — AB (ref 101–111)
CO2: 26 mmol/L (ref 22–32)
CREATININE: 0.75 mg/dL (ref 0.44–1.00)
GFR calc Af Amer: >60 mL/min (ref >60)
GFR calc non Af Amer: >60 mL/min (ref >60)
Glucose, Bld: 183 mg/dL — ABNORMAL HIGH (ref 65–99)
Potassium: 3.9 mmol/L (ref 3.5–5.1)
SODIUM: 133 mmol/L — AB (ref 135–145)

## 2015-09-10 NOTE — Progress Notes (Signed)
Foley d/c'd at 0530. 400cc output

## 2015-09-10 NOTE — Progress Notes (Signed)
Clinical Education officer, museum (CSW) received SNF consult. PT is recommending home health. RN Case Manager is aware of above. Please reconsult if future social work needs arise. CSW signing off.   Blima Rich, [place] [phone]

## 2015-09-10 NOTE — Care Management (Addendum)
Rolling walker Rx obtained from Dr. Rudene Christians. Home health orders faxed to [facility]. RNCM to update [facility] at time of discharge [phone]. Received callback from [place] at [place]- $40 co-pay per visit. Patient is aware and agrees. She scheduled for Monday for HHPT services. She mentioned during my visit as I noticed she has not eaten her meal- she "did not like it". She declined another option. She states "her daughter brought [employer] and she tolerated it.

## 2015-09-10 NOTE — Progress Notes (Signed)
   Subjective: 1 Day Post-Op Procedure(s) (LRB): TOTAL HIP ARTHROPLASTY ANTERIOR APPROACH (Right) Patient reports pain as mild.   Patient is well, and has had no acute complaints or problems We will start therapy today.  Plan is to go Rehab after hospital stay.  Objective: Vital signs in last 24 hours: Temp:  [97 F (36.1 C)-99.8 F (37.7 C)] 98.4 F (36.9 C) (11/09 0721) Pulse Rate:  [52-119] 119 (11/09 0721) Resp:  [16-18] 16 (11/09 0721) BP: (82-146)/(55-76) 141/72 mmHg (11/09 0721) SpO2:  [92 %-100 %] 95 % (11/09 0721) FiO2 (%):  [21 %] 21 % (11/08 1200) Weight:  [124 kg (273 lb 5.9 oz)] 124 kg (273 lb 5.9 oz) (11/08 1413)  Intake/Output from previous day: 11/08 0701 - 11/09 0700 In: 6210 [I.V.:6210] Out: 2470 [Urine:2170; Blood:300] Intake/Output this shift: Total I/O In: -  Out: 250 [Urine:250]   Recent Labs  09/09/15 1225  HGB 12.7    Recent Labs  09/09/15 1225  WBC 14.2*  RBC 4.28  HCT 39.2  PLT 185    Recent Labs  09/09/15 1225  CREATININE 0.92   No results for input(s): LABPT, INR in the last 72 hours.  EXAM General - Patient is Alert, Appropriate and Oriented Extremity - Neurovascular intact Sensation intact distally Intact pulses distally Dorsiflexion/Plantar flexion intact Dressing - dressing C/D/I and wound vac intact Motor Function - intact, moving foot and toes well on exam.   Past Medical History  Diagnosis Date  . Hypertension   . Sleep apnea   . Diabetes mellitus without complication ([place])   . Depression   . Anxiety   . Arthritis   . Fibromyalgia   . NASH (nonalcoholic steatohepatitis)     in 2 sisters , runs in family  . Cancer [facility])     colon polyp  . Liver fatty degeneration   . GERD (gastroesophageal reflux disease)   . DDD (degenerative disc disease), lumbar   . IBS (irritable bowel syndrome)   . RLS (restless legs syndrome)   . DDD (degenerative disc disease), cervical   . Painful legs and moving toes    leaking veins in legs    Assessment/Plan:   1 Day Post-Op Procedure(s) (LRB): TOTAL HIP ARTHROPLASTY ANTERIOR APPROACH (Right) Active Problems:   Primary osteoarthritis of right hip  Estimated body mass index is 45.49 kg/(m^2) as calculated from the following:   Height as of this encounter: 5\' 5"  (1.651 m).   Weight as of this encounter: 124 kg (273 lb 5.9 oz). Advance diet Up with therapy Needs BM Recheck labs in the am  DVT Prophylaxis - Lovenox, Foot Pumps and TED hose Weight-Bearing as tolerated to right leg D/C O2 and Pulse OX and try on Room Air  T. Rachelle Hora, PA-C [place] 09/10/2015, 7:55 AM

## 2015-09-10 NOTE — Anesthesia Postprocedure Evaluation (Signed)
  Anesthesia Post-op Note  Patient: Erin Wiley  Procedure(s) Performed: Procedure(s): TOTAL HIP ARTHROPLASTY ANTERIOR APPROACH (Right)  Anesthesia type:Spinal  Patient location: Room 160  Post pain: Pain level controlled  Post assessment: Post-op Vital signs reviewed, Patient's Cardiovascular Status Stable, Respiratory Function Stable, Patent Airway and No signs of Nausea or vomiting  Post vital signs: Reviewed and stable  Last Vitals:  Filed Vitals:   09/10/15 0721  BP: 141/72  Pulse: 119  Temp: 36.9 C  Resp: 16    Level of consciousness: awake, alert  and patient cooperative  Complications: No apparent anesthesia complications.  MAE =  No complaints

## 2015-09-10 NOTE — Progress Notes (Signed)
Physical Therapy Treatment Patient Details Name: Erin Wiley MRN: [id number] DOB: 12-05-1955 Today's Date: 09/10/2015    History of Present Illness Pt underwent R THR anterior approach without reported post-op complications. Evaluation performed on POD#0. Pt reports full independent community ambulation prior to surgery without history of falls. No reported use of assistive device at baseline    PT Comments    Pt demonstrates increased ambulation distance and speed this afternoon. She is able to progress to partial reciprocal pattern but is still antalgic with decreased stance time on RLE. Pt with improving stability with transfers. She is able to perform all supine exercises as instructed with persistent R hip flexion weakness noted. Pt will benefit from skilled PT services to address deficits in strength, balance, and mobility in order to return to full function at home.    Follow Up Recommendations  Home health PT;Supervision - Intermittent     Equipment Recommendations  Rolling walker with 5" wheels    Recommendations for Other Services       Precautions / Restrictions Precautions Precautions: Anterior Hip Precaution Booklet Issued: Yes (comment) Restrictions Weight Bearing Restrictions: Yes RLE Weight Bearing: Weight bearing as tolerated    Mobility  Bed Mobility Overal bed mobility: Needs Assistance Bed Mobility: Supine to Sit     Supine to sit: Min assist     General bed mobility comments: Pt again requires some assist for RLE adduction. UE assist to pull upright from supine. Pt again requires cues for sequencing and HOB elevated  Transfers Overall transfer level: Needs assistance Equipment used: Rolling walker (2 wheeled) Transfers: Sit to/from Stand Sit to Stand: Min guard         General transfer comment: Pt again requires cues for safe hand placement and proper anterior weights shifting. She performs transfer slowly but is able to come to upright  safely. Once upright pt is able to balance without UE support. Performed sit to stand transfer from commode as well.  Ambulation/Gait Ambulation/Gait assistance: Min guard Ambulation Distance (Feet): 60 Feet Assistive device: Rolling walker (2 wheeled) Gait Pattern/deviations: Step-to pattern;Decreased step length - left;Antalgic Gait velocity: Decreased Gait velocity interpretation: <1.8 ft/sec, indicative of risk for recurrent falls General Gait Details: Pt provided cues to increase L step length during gait as well as increase speed. Speed is very slow but improves with distance. Pt re-educated about proper sequencing with walker. Pt able to increase ambulation distance considerably this afternoon.    Stairs            Wheelchair Mobility    Modified Rankin (Stroke Patients Only)       Balance Overall balance assessment: Needs assistance   Sitting balance-Leahy Scale: Good       Standing balance-Leahy Scale: Fair                      Cognition Arousal/Alertness: Awake/alert Behavior During Therapy: WFL for tasks assessed/performed Overall Cognitive Status: Within Functional Limits for tasks assessed                      Exercises Total Joint Exercises Ankle Circles/Pumps: Strengthening;Both;15 reps;Supine Quad Sets: Strengthening;Both;15 reps;Supine Gluteal Sets: Strengthening;Both;15 reps;Supine Towel Squeeze: Strengthening;Both;15 reps;Supine Heel Slides: Strengthening;Right;10 reps;Supine Hip ABduction/ADduction: Strengthening;Supine;Both;10 reps Straight Leg Raises: Strengthening;Right;10 reps;Supine    General Comments        Pertinent Vitals/Pain Pain Assessment: 0-10 Pain Score: 7  Pain Location: R hip Pain Intervention(s): Monitored during session;Premedicated before session  Home Living                      Prior Function            PT Goals (current goals can now be found in the care plan section) Acute Rehab  PT Goals Patient Stated Goal: "I just really need to move." PT Goal Formulation: With patient/family Time For Goal Achievement: 09/23/15 Potential to Achieve Goals: Good Progress towards PT goals: Progressing toward goals    Frequency  BID    PT Plan Current plan remains appropriate    Co-evaluation             End of Session Equipment Utilized During Treatment: Gait belt Activity Tolerance: Patient tolerated treatment well Patient left: in chair;with call bell/phone within reach;with chair alarm set;with family/visitor present;with SCD's reapplied (RLE elevated)     Time: 2957-4734 PT Time Calculation (min) (ACUTE ONLY): 33 min  Charges:  $Gait Training: 8-22 mins $Therapeutic Exercise: 8-22 mins                    G Codes:      Lyndel Safe Nakaya Mishkin PT, DPT   Albena Comes 09/10/2015, 11:34 AM

## 2015-09-10 NOTE — Progress Notes (Signed)
Physical Therapy Treatment Patient Details Name: Erin Wiley MRN: [id number] DOB: 1956-01-31 Today's Date: 09/10/2015    History of Present Illness Pt underwent R THR anterior approach without reported post-op complications. Evaluation performed on POD#0. Pt reports full independent community ambulation prior to surgery without history of falls. No reported use of assistive device at baseline    PT Comments    Pt making excellent with therapy although requires some encouragement. She is able to progress ambulation distance on this date and continues with weak R hip flexion with seated marches. Pt able to increased gait speed and progress to reciprocal pattern although with decreased stance time on RLE. Will attempt full lap around RN station as well as stairs tomorrow. Pt will benefit from skilled PT services to address deficits in strength, balance, and mobility in order to return to full function at home.    Follow Up Recommendations  Home health PT;Supervision - Intermittent     Equipment Recommendations  Rolling walker with 5" wheels    Recommendations for Other Services       Precautions / Restrictions Precautions Precautions: Anterior Hip Precaution Booklet Issued: Yes (comment) Restrictions Weight Bearing Restrictions: Yes RLE Weight Bearing: Weight bearing as tolerated    Mobility  Bed Mobility Overal bed mobility: Needs Assistance Bed Mobility: Sit to Supine     Supine to sit: Min assist Sit to supine: Min assist   General bed mobility comments: Pt educated about how to use LLE to assist RLE when returning to bed. Assist provided by therapist as well and pt utilizes overhead trapeze  Transfers Overall transfer level: Needs assistance Equipment used: Rolling walker (2 wheeled) Transfers: Sit to/from Stand Sit to Stand: Min guard         General transfer comment: Pt again requires cues for safe hand placement and proper anterior weight shifting. She tends  to keep weight over her heels and requires extra strength in LEs to come to standing. Once upright pt is steady.   Ambulation/Gait Ambulation/Gait assistance: Min guard Ambulation Distance (Feet): 150 Feet Assistive device: Rolling walker (2 wheeled) Gait Pattern/deviations: Step-through pattern;Decreased step length - left;Decreased stance time - right;Antalgic Gait velocity: Decreased Gait velocity interpretation: <1.8 ft/sec, indicative of risk for recurrent falls General Gait Details: Pt still with R antalgic gait. Cues to increase L step length and increase speed overall. As speed increases gait normalizes. Pt with intermittent RLE buckling but easily self corrects. Appropriate use of UE for support however at times excessive. Pt encouraged to decrease reliance on bilateral UEs. Pt able to increase ambulation distance during PM session. Requires one standing rest break   Stairs            Wheelchair Mobility    Modified Rankin (Stroke Patients Only)       Balance Overall balance assessment: Needs assistance   Sitting balance-Leahy Scale: Good       Standing balance-Leahy Scale: Fair                      Cognition Arousal/Alertness: Lethargic Behavior During Therapy: WFL for tasks assessed/performed Overall Cognitive Status: Within Functional Limits for tasks assessed                      Exercises Total Joint Exercises Ankle Circles/Pumps: Strengthening;Both;15 reps;Seated (Heel raises) Quad Sets: Strengthening;Both;15 reps;Supine Gluteal Sets: Strengthening;Both;15 reps;Supine Towel Squeeze: Strengthening;Both;15 reps;Supine Heel Slides: Strengthening;Right;10 reps;Supine Hip ABduction/ADduction: Strengthening;Both;Seated;15 reps Straight Leg Raises: Strengthening;Right;10 reps;Supine Long Arc  Quad: Strengthening;Both;15 reps;Seated Knee Flexion: Strengthening;Both;15 reps;Seated Marching in Standing: Strengthening;Both;15 reps;Seated     General Comments        Pertinent Vitals/Pain Pain Assessment: 0-10 Pain Score: 8  Pain Location: R hip Pain Intervention(s): Monitored during session;Premedicated before session;Limited activity within patient's tolerance    Home Living                      Prior Function            PT Goals (current goals can now be found in the care plan section) Acute Rehab PT Goals Patient Stated Goal: "I just really need to move." PT Goal Formulation: With patient/family Time For Goal Achievement: 09/23/15 Potential to Achieve Goals: Good Progress towards PT goals: Progressing toward goals    Frequency  BID    PT Plan Current plan remains appropriate    Co-evaluation             End of Session Equipment Utilized During Treatment: Gait belt Activity Tolerance: Patient tolerated treatment well Patient left: with family/visitor present;with SCD's reapplied;in bed;with call bell/phone within reach;with bed alarm set (Pillow under heel)     Time: 7902-4097 PT Time Calculation (min) (ACUTE ONLY): 27 min  Charges:  $Gait Training: 8-22 mins $Therapeutic Exercise: 8-22 mins                    G Codes:      Erin Wiley PT, DPT   Erin Wiley 09/10/2015, 3:03 PM

## 2015-09-11 LAB — GLUCOSE, CAPILLARY
GLUCOSE-CAPILLARY: 161 mg/dL — AB (ref 65–99)
GLUCOSE-CAPILLARY: 144 mg/dL — AB (ref 65–99)
Glucose-Capillary: 176 mg/dL — ABNORMAL HIGH (ref 65–99)
Glucose-Capillary: 174 mg/dL — ABNORMAL HIGH (ref 65–99)

## 2015-09-11 LAB — CBC
HEMATOCRIT: 32.9 % — AB (ref 35.0–47.0)
HEMOGLOBIN: 11.1 g/dL — AB (ref 12.0–16.0)
MCH: 30.3 pg (ref 26.0–34.0)
MCHC: 33.8 g/dL (ref 32.0–36.0)
MCV: 89.6 fL (ref 80.0–100.0)
Platelets: 166 10*3/uL (ref 150–440)
RBC: 3.67 MIL/uL — AB (ref 3.80–5.20)
RDW: 13.4 % (ref 11.5–14.5)
WBC: 14 10*3/uL — ABNORMAL HIGH (ref 3.6–11.0)

## 2015-09-11 LAB — SURGICAL PATHOLOGY

## 2015-09-11 NOTE — Progress Notes (Signed)
Physical Therapy Treatment Patient Details Name: Erin Wiley MRN: [id number] DOB: Jan 14, 1956 Today's Date: 09/11/2015    History of Present Illness Pt underwent R THR anterior approach without reported post-op complications. Evaluation performed on POD#0. Pt reports full independent community ambulation prior to surgery without history of falls. No reported use of assistive device at baseline    PT Comments    Pt complains of pain in R hip and lethargy/groggy feeling this morn. Pt agreeable to up in chair and basic exercises. Pt continues to require Min A for bed mobility and Min guard and cues for transfers/ambulation. PT comfortable up in chair and participated in basic isometric exercises; pt encouraged to continue throughout the morning. Plan to see pt this afternoon for continued strengthening and progressive ambulation to allow for return home post hospital stay.   Follow Up Recommendations  Home health PT;Supervision - Intermittent     Equipment Recommendations  Rolling walker with 5" wheels    Recommendations for Other Services       Precautions / Restrictions Precautions Precautions: Anterior Hip Restrictions Weight Bearing Restrictions: Yes RLE Weight Bearing: Weight bearing as tolerated    Mobility  Bed Mobility Overal bed mobility: Needs Assistance Bed Mobility: Supine to Sit     Supine to sit: Min assist     General bed mobility comments: Increased time. slow guarded movements; requires Min A for RLE and trunk flexion. Demonstrates good ability to gently bridge hips to ede of bed  Transfers Overall transfer level: Needs assistance Equipment used: Rolling walker (2 wheeled) Transfers: Sit to/from Stand Sit to Stand: Min guard         General transfer comment: cues for safe hand placement and activation of glute muscles for upright stand  Ambulation/Gait Ambulation/Gait assistance: Min guard Ambulation Distance (Feet): 4 Feet Assistive device:  Rolling walker (2 wheeled) Gait Pattern/deviations: Decreased stance time - right;Decreased weight shift to right (side and backward step to chair) Gait velocity: Decreased Gait velocity interpretation: <1.8 ft/sec, indicative of risk for recurrent falls General Gait Details: antalgic on R, small side and backward steps to chair. does demonstrate ability to clear B feet slightly from floor   Stairs            Wheelchair Mobility    Modified Rankin (Stroke Patients Only)       Balance Overall balance assessment: Needs assistance         Standing balance support: Bilateral upper extremity supported Standing balance-Leahy Scale: Fair                      Cognition Arousal/Alertness: Lethargic Behavior During Therapy: WFL for tasks assessed/performed Overall Cognitive Status: Within Functional Limits for tasks assessed       Memory: Decreased recall of precautions              Exercises Total Joint Exercises Ankle Circles/Pumps: AROM;Both;20 reps (long sit) Quad Sets: Strengthening;Both;20 reps (long sit) Gluteal Sets: Strengthening;Both;20 reps (long sit) Towel Squeeze: Strengthening;Both;20 reps (long sit)    General Comments        Pertinent Vitals/Pain Pain Assessment: 0-10 Pain Score: 6  Pain Location: R hip Pain Descriptors / Indicators: Aching;Constant Pain Intervention(s): Limited activity within patient's tolerance;Premedicated before session;Repositioned    Home Living                      Prior Function            PT Goals (current goals  can now be found in the care plan section) Progress towards PT goals: Progressing toward goals (slowly)    Frequency  BID    PT Plan Current plan remains appropriate    Co-evaluation             End of Session Equipment Utilized During Treatment: Gait belt Activity Tolerance: Patient limited by lethargy;Patient limited by pain Patient left: in chair;with call bell/phone  within reach;with chair alarm set;with SCD's reapplied     Time: [id number] PT Time Calculation (min) (ACUTE ONLY): 23 min  Charges:  $Gait Training: 8-22 mins $Therapeutic Exercise: 8-22 mins                    G Codes:      [name] 09/11/2015, 10:08 AM

## 2015-09-11 NOTE — Progress Notes (Signed)
Physical Therapy Treatment Patient Details Name: Erin Wiley MRN: [id number] DOB: 1955-12-31 Today's Date: 09/11/2015    History of Present Illness Pt underwent R THR anterior approach without reported post-op complications. Evaluation performed on POD#0. Pt reports full independent community ambulation prior to surgery without history of falls. No reported use of assistive device at baseline    PT Comments    Pt notes severe nausea this afternoon and just returned to bed; nursing in to medicate for nausea. Pt lethargic.  Pt participate in brief exercise session for right lower extremity, but cannot tolerate additional therapy at this time. Continue PT to progress active range of motion, strength, bed mobility, transfers and ambulation. Pt wishes to go home post discharge, but will need to demonstrate improved mobility. Home with home health PT versus skilled nursing facility at this time.   Follow Up Recommendations  Home health PT;Supervision - Intermittent (pt will need to demonstrate ability to ambulate; or SNF)     Equipment Recommendations  Rolling walker with 5" wheels (received)    Recommendations for Other Services       Precautions / Restrictions Precautions Precautions: Anterior Hip Precaution Booklet Issued: Yes (comment) Restrictions Weight Bearing Restrictions: Yes RLE Weight Bearing: Weight bearing as tolerated    Mobility  Bed Mobility               General bed mobility comments: Pt just returned to bed from up in chair all morning; pt with severe nausea complaints  Transfers                    Ambulation/Gait                 Stairs            Wheelchair Mobility    Modified Rankin (Stroke Patients Only)       Balance                                    Cognition Arousal/Alertness: Lethargic Behavior During Therapy: WFL for tasks assessed/performed Overall Cognitive Status: Within Functional Limits for  tasks assessed                      Exercises Total Joint Exercises Ankle Circles/Pumps: AROM;Both;20 reps Quad Sets: Strengthening;Both;10 reps;Supine Gluteal Sets: Strengthening;Both;10 reps;Supine Towel Squeeze: Strengthening;Both;10 reps;Supine Short Arc Quad: Strengthening;Right;10 reps;Supine Heel Slides: AAROM;Right;10 reps;Supine Hip ABduction/ADduction: AAROM;Right;10 reps;Supine    General Comments        Pertinent Vitals/Pain Pain Assessment: 0-10 Pain Score: 6  Pain Location: R hip Pain Descriptors / Indicators: Aching;Constant Pain Intervention(s): Premedicated before session;Limited activity within patient's tolerance (activity limited due to nausea)    Home Living Family/patient expects to be discharged to:: Private residence Living Arrangements: Children (will stay at daughters) Available Help at Discharge: Family Type of Home: House Home Access: Stairs to enter Entrance Stairs-Rails: Can reach both Home Layout: One level Home Equipment: None      Prior Function Level of Independence: Independent          PT Goals (current goals can now be found in the care plan section) Acute Rehab PT Goals Patient Stated Goal: To go home tomorrow Progress towards PT goals:  (limted today; little progress)    Frequency  BID    PT Plan Current plan remains appropriate    Co-evaluation  End of Session   Activity Tolerance: Patient limited by lethargy (limited by nausea) Patient left: in bed;with call bell/phone within reach;with bed alarm set;with SCD's reapplied     Time: [id number] PT Time Calculation (min) (ACUTE ONLY): 10 min  Charges:  $Therapeutic Exercise: 8-22 mins                    G Codes:      Erin Wiley 09/11/2015, 2:35 PM

## 2015-09-11 NOTE — Evaluation (Signed)
Occupational Therapy Evaluation Patient Details Name: Erin Wiley MRN: [id number] DOB: Feb 25, 1956 Today's Date: 09/11/2015    History of Present Illness Pt underwent R THR anterior approach without reported post-op complications. Evaluation performed on POD#0. Pt reports full independent community ambulation prior to surgery without history of falls. No reported use of assistive device at baseline   Clinical Impression   59 year old female who came to [facility] for the above reason. She will be going home with her daughter for recovery.    Follow Up Recommendations       Equipment Recommendations       Recommendations for Other Services       Precautions / Restrictions Precautions Precautions: Anterior Hip Precaution Booklet Issued: Yes (comment) Restrictions Weight Bearing Restrictions: Yes RLE Weight Bearing: Weight bearing as tolerated      Mobility Bed Mobility  Transfers            Balance                                    ADL                                         General ADL Comments: Patient had been indpendent with ADL. She practiced techniques for lower body dressing using hip kit with minimal assist to Donned/doffed socks and pants to knees (drain still in place). She needed cues for technique and safety and minimal assist.      Vision     Perception     Praxis      Pertinent Vitals/Pain Pain Assessment: 0-10 Pain Score: 6  Pain Location: right hip Pain Descriptors / Indicators: Aching;Constant Pain Intervention(s): Limited activity within patient's tolerance;Premedicated before session;Repositioned     Hand Dominance Right   Extremity/Trunk Assessment Upper Extremity Assessment Upper Extremity Assessment: Overall WFL for tasks assessed   Lower Extremity Assessment Lower Extremity Assessment: Defer to PT evaluation       Communication Communication Communication: No  difficulties   Cognition Arousal/Alertness: Lethargic (but able to participate) Behavior During Therapy: WFL for tasks assessed/performed Overall Cognitive Status: Within Functional Limits for tasks assessed       Memory: Decreased recall of precautions             General Comments       Exercises Exercises: Total Joint     Shoulder Instructions      Home Living Family/patient expects to be discharged to:: Private residence Living Arrangements: Children (will stay at daughters) Available Help at Discharge: Family Type of Home: House Home Access: Stairs to enter   Entrance Stairs-Rails: Can reach both Home Layout: One level     Bathroom Shower/Tub: Occupational psychologist: Standard     Home Equipment: None          Prior Functioning/Environment Level of Independence: Independent             OT Diagnosis: Acute pain   OT Problem List:     OT Treatment/Interventions: Self-care/ADL training    OT Goals(Current goals can be found in the care plan section) Acute Rehab OT Goals Patient Stated Goal: To go home tomorrow OT Goal Formulation: With patient Time For Goal Achievement: 09/25/15 Potential to Achieve Goals: Good  OT Frequency: Min 1X/week  Barriers to D/C:            Co-evaluation              End of Session Equipment Utilized During Treatment:  (hip kit)  Activity Tolerance:   Patient left: in chair;with call bell/phone within reach;with chair alarm set   Time: 0962-8366 OT Time Calculation (min): 14 min Charges:  OT General Charges $OT Visit: 1 Procedure OT Evaluation $Initial OT Evaluation Tier I: 1 Procedure G-Codes:    Myrene Galas, MS/OTR/L  09/11/2015, 11:39 AM

## 2015-09-11 NOTE — Progress Notes (Signed)
   Subjective: 2 Days Post-Op Procedure(s) (LRB): TOTAL HIP ARTHROPLASTY ANTERIOR APPROACH (Right) Patient reports pain as mild.   Patient is well, and has had no acute complaints or problems We will continue with therapy today.  Plan is to go home tomorrow   Objective: Vital signs in last 24 hours: Temp:  [98.3 F (36.8 C)-100.3 F (37.9 C)] 99.6 F (37.6 C) (11/10 0732) Pulse Rate:  [94-112] 105 (11/10 0732) Resp:  [16-20] 18 (11/10 0457) BP: (102-128)/(63-76) 119/63 mmHg (11/10 0732) SpO2:  [93 %-96 %] 93 % (11/10 0732)  Intake/Output from previous day: 11/09 0701 - 11/10 0700 In: 120 [P.O.:120] Out: 2000 [Urine:2000] Intake/Output this shift:     Recent Labs  09/09/15 1225 09/11/15 0644  HGB 12.7 11.1*    Recent Labs  09/09/15 1225 09/11/15 0644  WBC 14.2* 14.0*  RBC 4.28 3.67*  HCT 39.2 32.9*  PLT 185 166    Recent Labs  09/09/15 1225 09/10/15 0802  NA  --  133*  K  --  3.9  CL  --  98*  CO2  --  26  BUN  --  11  CREATININE 0.92 0.75  GLUCOSE  --  183*  CALCIUM  --  8.6*   No results for input(s): LABPT, INR in the last 72 hours.  EXAM General - Patient is Alert, Appropriate and Oriented Extremity - Neurovascular intact Sensation intact distally Intact pulses distally Dorsiflexion/Plantar flexion intact Dressing - dressing C/D/I and wound vac intact Motor Function - intact, moving foot and toes well on exam.   Past Medical History  Diagnosis Date  . Hypertension   . Sleep apnea   . Diabetes mellitus without complication ([place])   . Depression   . Anxiety   . Arthritis   . Fibromyalgia   . NASH (nonalcoholic steatohepatitis)     in 2 sisters , runs in family  . Cancer [facility])     colon polyp  . Liver fatty degeneration   . GERD (gastroesophageal reflux disease)   . DDD (degenerative disc disease), lumbar   . IBS (irritable bowel syndrome)   . RLS (restless legs syndrome)   . DDD (degenerative disc disease), cervical   . Painful  legs and moving toes     leaking veins in legs    Assessment/Plan:   2 Days Post-Op Procedure(s) (LRB): TOTAL HIP ARTHROPLASTY ANTERIOR APPROACH (Right) Active Problems:   Primary osteoarthritis of right hip   Acute post op blood loss anemia   Estimated body mass index is 45.49 kg/(m^2) as calculated from the following:   Height as of this encounter: 5\' 5"  (1.651 m).   Weight as of this encounter: 124 kg (273 lb 5.9 oz). Advance diet Up with therapy Needs BM Plan on discharge to home Friday. Will remove wound vac and apply honeycomb dressing.  DVT Prophylaxis - Lovenox, Foot Pumps and TED hose Weight-Bearing as tolerated to right leg D/C O2 and Pulse OX and try on Room Air  T. Rachelle Hora, PA-C [place] 09/11/2015, 7:59 AM

## 2015-09-12 LAB — GLUCOSE, CAPILLARY
GLUCOSE-CAPILLARY: 159 mg/dL — AB (ref 65–99)
Glucose-Capillary: 161 mg/dL — ABNORMAL HIGH (ref 65–99)

## 2015-09-12 MED ORDER — FLEET ENEMA 7-19 GM/118ML RE ENEM
1.0000 | ENEMA | Freq: Once | RECTAL | Status: AC
  Administered 2015-09-12: 1 via RECTAL

## 2015-09-12 MED ORDER — ENOXAPARIN SODIUM 40 MG/0.4ML ~~LOC~~ SOLN: 40.0000 mg | SUBCUTANEOUS | Status: AC

## 2015-09-12 MED ORDER — OXYCODONE HCL 5 MG PO TABS: 5.0000 mg | ORAL_TABLET | ORAL | Status: AC | PRN

## 2015-09-12 NOTE — Progress Notes (Signed)
Physical Therapy Treatment Patient Details Name: Erin Wiley MRN: [id number] DOB: 05/30/1956 Today's Date: 09/12/2015    History of Present Illness Pt underwent R THR anterior approach without reported post-op complications. Initial evaluation performed on POD#0. Pt reports full independent community ambulation prior to surgery without history of falls. No reported use of assistive device at baseline    PT Comments    Pt able to progress functional mobility this morning to min assist (for R LE) supine to/from sit, SBA with transfers using RW, SBA with gait using RW (around nursing loop), and CGA with navigating 3 stairs with B railings.  Pt reports she will have 24/7 assist at her daughter's home (from family) to assist as needed.  Pt appears safe to discharge home with this support (and assist levels noted above).  Plan to discharge to her daughter's home today.   Follow Up Recommendations  Home health PT;Supervision for mobility/OOB     Equipment Recommendations  Rolling walker with 5" wheels (pt fitted for RW for home use)    Recommendations for Other Services       Precautions / Restrictions Precautions Precautions: Anterior Hip Precaution Booklet Issued: Yes (comment) Restrictions Weight Bearing Restrictions: Yes RLE Weight Bearing: Weight bearing as tolerated    Mobility  Bed Mobility Overal bed mobility: Needs Assistance Bed Mobility: Supine to Sit;Sit to Supine     Supine to sit: Min assist Sit to supine: Min assist   General bed mobility comments: assist for R LE; increased time to perform  Transfers Overall transfer level: Needs assistance Equipment used: Rolling walker (2 wheeled) Transfers: Sit to/from Stand Sit to Stand: Supervision         General transfer comment: increased time to perform with extra effort by pt but pt able to perform on her own with SBA  Ambulation/Gait Ambulation/Gait assistance: Supervision Ambulation Distance (Feet):  (90  feet; 170 feet) Assistive device: Rolling walker (2 wheeled) Gait Pattern/deviations: Decreased step length - left;Decreased weight shift to right;Decreased stance time - right Gait velocity: decreased   General Gait Details: antalgic; decreased stance time R LE   Stairs Stairs: Yes Stairs assistance: Min guard Stair Management: Two rails;Step to pattern;Forwards Number of Stairs: 4 General stair comments: initial vc's and demo required for technique and then pt able to perform on her own without vc's and CGA  Wheelchair Mobility    Modified Rankin (Stroke Patients Only)       Balance Overall balance assessment: Needs assistance Sitting-balance support: Feet supported Sitting balance-Leahy Scale: Normal     Standing balance support: Bilateral upper extremity supported (on RW) Standing balance-Leahy Scale: Good                      Cognition Arousal/Alertness: Awake/alert Behavior During Therapy: WFL for tasks assessed/performed Overall Cognitive Status: Within Functional Limits for tasks assessed                      Exercises      General Comments   Nursing cleared pt for participation in physical therapy.  Pt agreeable to PT session.      Pertinent Vitals/Pain Pain Assessment: 0-10 Pain Score: 8  (at rest and with activity) Pain Location: R hip Pain Descriptors / Indicators: Aching;Constant Pain Intervention(s): Limited activity within patient's tolerance;Monitored during session;Premedicated before session;Repositioned  Vitals stable and WFL throughout treatment session.    Home Living  Prior Function            PT Goals (current goals can now be found in the care plan section) Acute Rehab PT Goals Patient Stated Goal: to go home PT Goal Formulation: With patient Time For Goal Achievement: 09/23/15 Potential to Achieve Goals: Good Progress towards PT goals: Progressing toward goals    Frequency   BID    PT Plan Discharge plan needs to be updated    Co-evaluation             End of Session Equipment Utilized During Treatment: Gait belt Activity Tolerance: No increased pain Patient left: in chair;with call bell/phone within reach;with chair alarm set (B heels elevated via pillow)     Time: [id number] PT Time Calculation (min) (ACUTE ONLY): 38 min  Charges:  $Gait Training: 23-37 mins $Therapeutic Activity: 8-22 mins                    G CodesLeitha Bleak 10-02-2015, 9:53 AM Leitha Bleak, [place]

## 2015-09-12 NOTE — Discharge Instructions (Signed)

## 2015-09-12 NOTE — Care Management Note (Signed)
Case Management Note  Patient Details  Name: Erin Wiley MRN: [id number] Date of Birth: 1956-02-26  Subjective/Objective:   [name] at [name] is out) and updated her about [name]'s discharge today to her daughter's home in [place]. A copy of all discharge paperwork was faxed to [facility] at Fax: [phone]. [name] will call this Probation officer if any questions arise concerning this referral for home health PT. [name] already has 3:1 DME.                  Action/Plan:   Expected Discharge Date:                  Expected Discharge Plan:     In-House Referral:     Discharge planning Services  CM Consult  Post Acute Care Choice:  Durable Medical Equipment, Home Health Choice offered to:  Patient, Adult Children  DME Arranged:  Walker rolling DME Agency:     HH Arranged:  PT [place]:  [facility]  Status of Service:  In process, will continue to follow  Medicare Important Message Given:    Date Medicare IM Given:    Medicare IM give by:    Date Additional Medicare IM Given:    Additional Medicare Important Message give by:     If discussed at [place] of Stay Meetings, dates discussed:    Additional Comments:  Jerryl Holzhauer A, RN 09/12/2015, 8:43 AM

## 2015-09-12 NOTE — Progress Notes (Signed)
Discharg  linstructions and lovenox demonstrations given to  Pt , pt verbalizes understanding , discharged to home care of daughter [place]

## 2015-09-12 NOTE — Progress Notes (Signed)
   Subjective: 3 Days Post-Op Procedure(s) (LRB): TOTAL HIP ARTHROPLASTY ANTERIOR APPROACH (Right) Patient reports pain as mild.   Patient is well, but has had some minor complaints of nausea with out vomiting. Patient with out BM, no abdmoinal pain. Passing gas. We will continue with therapy today.  Plan is to go home today.   Objective: Vital signs in last 24 hours: Temp:  [98.3 F (36.8 C)-99.2 F (37.3 C)] 98.5 F (36.9 C) (11/11 0745) Pulse Rate:  [81-109] 109 (11/11 0745) Resp:  [16-18] 16 (11/11 0745) BP: (105-118)/(64-68) 118/66 mmHg (11/11 0745) SpO2:  [93 %-97 %] 94 % (11/11 0745)  Intake/Output from previous day: 11/10 0701 - 11/11 0700 In: 720 [P.O.:720] Out: 1900 [Urine:1900] Intake/Output this shift:     Recent Labs  09/09/15 1225 09/11/15 0644  HGB 12.7 11.1*    Recent Labs  09/09/15 1225 09/11/15 0644  WBC 14.2* 14.0*  RBC 4.28 3.67*  HCT 39.2 32.9*  PLT 185 166    Recent Labs  09/09/15 1225 09/10/15 0802  NA  --  133*  K  --  3.9  CL  --  98*  CO2  --  26  BUN  --  11  CREATININE 0.92 0.75  GLUCOSE  --  183*  CALCIUM  --  8.6*   No results for input(s): LABPT, INR in the last 72 hours.  EXAM General - Patient is Alert, Appropriate and Oriented Extremity - Neurovascular intact Sensation intact distally Intact pulses distally Dorsiflexion/Plantar flexion intact Dressing - dressing C/D/I and wound vac discontinued, honeycomb placed. Motor Function - intact, moving foot and toes well on exam.   Past Medical History  Diagnosis Date  . Hypertension   . Sleep apnea   . Diabetes mellitus without complication ([place])   . Depression   . Anxiety   . Arthritis   . Fibromyalgia   . NASH (nonalcoholic steatohepatitis)     in 2 sisters , runs in family  . Cancer [facility])     colon polyp  . Liver fatty degeneration   . GERD (gastroesophageal reflux disease)   . DDD (degenerative disc disease), lumbar   . IBS (irritable bowel syndrome)    . RLS (restless legs syndrome)   . DDD (degenerative disc disease), cervical   . Painful legs and moving toes     leaking veins in legs    Assessment/Plan:   3 Days Post-Op Procedure(s) (LRB): TOTAL HIP ARTHROPLASTY ANTERIOR APPROACH (Right) Active Problems:   Primary osteoarthritis of right hip   Acute post op blood loss anemia   Estimated body mass index is 45.49 kg/(m^2) as calculated from the following:   Height as of this encounter: 5\' 5"  (1.651 m).   Weight as of this encounter: 124 kg (273 lb 5.9 oz). Advance diet Up with therapy Discharge home pending BM and progress with PT.  DVT Prophylaxis - Lovenox, Foot Pumps and TED hose Weight-Bearing as tolerated to right leg D/C O2 and Pulse OX and try on Room Air  T. Rachelle Hora, PA-C [place] 09/12/2015, 7:52 AM

## 2015-09-12 NOTE — Discharge Summary (Signed)
Physician Discharge Summary  Patient ID: Erin Wiley MRN: [id number] DOB/AGE: [birth year] 61 y.o.  Admit date: 09/09/2015 Discharge date: 09/12/2015  Admission Diagnoses:  OSTEOARTHRITIS   Discharge Diagnoses: Patient Active Problem List   Diagnosis Date Noted  . Primary osteoarthritis of right hip 09/09/2015    Past Medical History  Diagnosis Date  . Hypertension   . Sleep apnea   . Diabetes mellitus without complication ([place])   . Depression   . Anxiety   . Arthritis   . Fibromyalgia   . NASH (nonalcoholic steatohepatitis)     in 2 sisters , runs in family  . Cancer [facility])     colon polyp  . Liver fatty degeneration   . GERD (gastroesophageal reflux disease)   . DDD (degenerative disc disease), lumbar   . IBS (irritable bowel syndrome)   . RLS (restless legs syndrome)   . DDD (degenerative disc disease), cervical   . Painful legs and moving toes     leaking veins in legs     Transfusion: none   Consultants (if any):    Discharged Condition: Improved  Hospital Course: Erin Wiley is an 59 y.o. female who was admitted 09/09/2015 with a diagnosis of right hip OA and went to the operating room on 09/09/2015 and underwent the above named procedures.    Surgeries: Procedure(s): TOTAL HIP ARTHROPLASTY ANTERIOR APPROACH on 09/09/2015 Patient tolerated the surgery well. Taken to PACU where she was stabilized and then transferred to the orthopedic floor.  Started on Lovenox 40 q 24 hrs. Foot pumps applied bilaterally at 80 mm. Heels elevated on bed with rolled towels. No evidence of DVT. Negative [place]. Physical therapy started on day #1 for gait training and transfer. OT started day #1 for ADL and assisted devices.  Patient's foley was d/c on day #1. Patient's IV  was d/c on day #2.  On post op day #3 patient was stable and ready for discharge to home.  Implants: Medacta AMIS 5 collared stem, 52 mm mpact cup DM with liner and S 28 mm head   She was given  perioperative antibiotics:  Anti-infectives    Start     Dose/Rate Route Frequency Ordered Stop   09/09/15 1200  ceFAZolin (ANCEF) IVPB 2 g/50 mL premix     2 g 100 mL/hr over 30 Minutes Intravenous Every 6 hours 09/09/15 1159 09/10/15 0116   09/09/15 0551  ceFAZolin (ANCEF) 2-3 GM-% IVPB SOLR    Comments:  Idamae Lusher: cabinet override      09/09/15 0551 09/09/15 0747   09/09/15 0403  ceFAZolin (ANCEF) IVPB 2 g/50 mL premix  Status:  Discontinued     2 g 100 mL/hr over 30 Minutes Intravenous On call to O.R. 09/09/15 0403 09/09/15 1143    .  She was given sequential compression devices, early ambulation, and loveno for DVT prophylaxis.  She benefited maximally from the hospital stay and there were no complications.    Recent vital signs:  Filed Vitals:   09/12/15 0745  BP: 118/66  Pulse: 109  Temp: 98.5 F (36.9 C)  Resp: 16    Recent laboratory studies:  Lab Results  Component Value Date   HGB 11.1* 09/11/2015   HGB 12.7 09/09/2015   HGB 13.0 09/03/2015   Lab Results  Component Value Date   WBC 14.0* 09/11/2015   PLT 166 09/11/2015   Lab Results  Component Value Date   INR 0.96 09/03/2015   Lab Results  Component Value Date  NA 133* 09/10/2015   K 3.9 09/10/2015   CL 98* 09/10/2015   CO2 26 09/10/2015   BUN 11 09/10/2015   CREATININE 0.75 09/10/2015   GLUCOSE 183* 09/10/2015    Discharge Medications:     Medication List    TAKE these medications        acyclovir 200 MG capsule  Commonly known as:  ZOVIRAX  Take 200 mg by mouth. As needed     ALPRAZolam 1 MG tablet  Commonly known as:  XANAX  Take 1 mg by mouth 3 (three) times daily as needed for anxiety.     aspirin 81 MG tablet  Take 81 mg by mouth daily.     diclofenac 75 MG EC tablet  Commonly known as:  VOLTAREN  Take 75 mg by mouth 2 (two) times daily.     DULoxetine 60 MG capsule  Commonly known as:  CYMBALTA  Take 60 mg by mouth 2 (two) times daily.     enoxaparin 40 MG/0.4ML  injection  Commonly known as:  LOVENOX  Inject 0.4 mLs (40 mg total) into the skin daily.     HYDROcodone-acetaminophen 10-325 MG tablet  Commonly known as:  NORCO  Take 1 tablet by mouth 3 (three) times daily as needed.     lisinopril-hydrochlorothiazide 20-12.5 MG tablet  Commonly known as:  PRINZIDE,ZESTORETIC  Take 1 tablet by mouth daily.     metFORMIN 500 MG tablet  Commonly known as:  GLUCOPHAGE  Take 500 mg by mouth 2 (two) times daily with a meal.     metoprolol 50 MG tablet  Commonly known as:  LOPRESSOR  Take 50 mg by mouth every morning.     omeprazole 40 MG capsule  Commonly known as:  PRILOSEC  Take 40 mg by mouth daily.     oxyCODONE 5 MG immediate release tablet  Commonly known as:  Oxy IR/ROXICODONE  Take 1-2 tablets (5-10 mg total) by mouth every 3 (three) hours as needed for breakthrough pain.     penicillin v potassium 500 MG tablet  Commonly known as:  VEETID  Take 500 mg by mouth 3 (three) times daily.        Diagnostic Studies: Dg Hip Operative Unilat With Pelvis Right  09/09/2015  CLINICAL DATA:  Total hip replacement EXAM: OPERATIVE RIGHT HIP  1 VIEW TECHNIQUE: Fluoroscopic spot image was submitted for interpretation post-operatively. COMPARISON:  None. FINDINGS: Frontal view submitted. There is a total hip prosthesis on the right with prosthetic components appearing well-seated on this single view. No acute fracture or dislocation evident. IMPRESSION: Right total hip prosthetic components appear well seated on this single image. No acute fracture or dislocation evident. Electronically Signed   By: Lowella Grip III M.D.   On: 09/09/2015 09:55   Dg Hip Unilat W Or W/o Pelvis 2-3 Views Right  09/09/2015  ADDENDUM REPORT: 09/09/2015 11:04 ADDENDUM: These results will be called to the ordering clinician or representative by the Radiologist Assistant, and communication documented in the PACS or zVision Dashboard. Electronically Signed   By: Lowella Grip III M.D.   On: 09/09/2015 11:04  09/09/2015  CLINICAL DATA:  Status post total hip replacement EXAM: DG HIP  2-3V RIGHT COMPARISON:  Study obtained earlier in the day FINDINGS: Frontal and lateral views were obtained. There is a total hip replacement on the right with prosthetic components appearing well-seated. There is a fracture along the posterior aspect of the greater trochanter laterally. No other fracture. No  dislocation. There are skin staples on the right. IMPRESSION: Total hip prosthesis on the right with prosthesis well-seated. Fracture along the lateral intertrochanteric region on the right through the inferior greater trochanter with alignment near anatomic. No dislocation. Electronically Signed: By: Lowella Grip III M.D. On: 09/09/2015 10:59    Disposition: home        Follow-up Information    Follow up with MENZ,MICHAEL, MD In 2 weeks.   Specialty:  Orthopedic Surgery   Why:  For staple removal and skin check   Contact information:   [place] [address] [phone]        Signed: Dorise Hiss [facility] 09/12/2015, 7:59 AM

## 2015-12-03 ENCOUNTER — Ambulatory Visit: Payer: BLUE CROSS/BLUE SHIELD

## 2015-12-03 ENCOUNTER — Other Ambulatory Visit: Payer: Self-pay | Admitting: Orthopedic Surgery

## 2015-12-03 DIAGNOSIS — M5431 Sciatica, right side: Secondary | ICD-10-CM

## 2015-12-24 ENCOUNTER — Ambulatory Visit: Payer: BLUE CROSS/BLUE SHIELD

## 2016-01-13 ENCOUNTER — Ambulatory Visit
Admission: RE | Admit: 2016-01-13 | Discharge: 2016-01-13 | Disposition: A | Discharge status: Home / Self Care | Payer: BLUE CROSS/BLUE SHIELD | Source: Ambulatory Visit | Attending: Orthopedic Surgery | Admitting: Orthopedic Surgery

## 2016-01-13 DIAGNOSIS — M47816 Spondylosis without myelopathy or radiculopathy, lumbar region: Secondary | ICD-10-CM | POA: Diagnosis not present

## 2016-01-13 DIAGNOSIS — M5431 Sciatica, right side: Secondary | ICD-10-CM | POA: Diagnosis not present

## 2017-02-09 IMAGING — MR MR LUMBAR SPINE W/O CM
5 series · 39 of 48 positions shown · non-contrast
Comparison: None.

CLINICAL DATA: History of fall 10/17/2015. Low back pain radiating
into the right hip and leg for 1 year. No known injury. Initial
encounter.

EXAM:
MRI LUMBAR SPINE WITHOUT CONTRAST
TECHNIQUE: Multiplanar, multisequence MR imaging of the lumbar spine was
performed. No intravenous contrast was administered.

[Series 3: T2 · sagittal · 4.0mm · 0.81mm/px · 6 of 15 slices shown (1 of 2)]
[im 1/15]
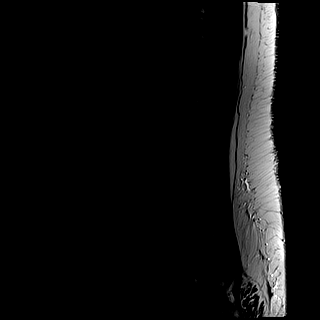
[im 3/15]
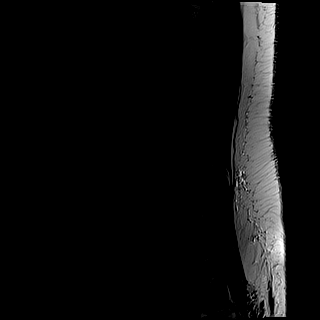
[im 6/15]
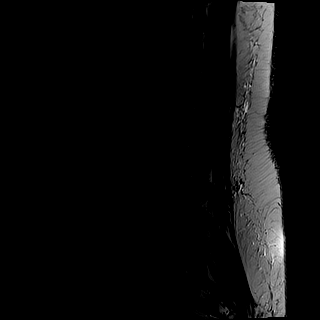
[im 9/15]
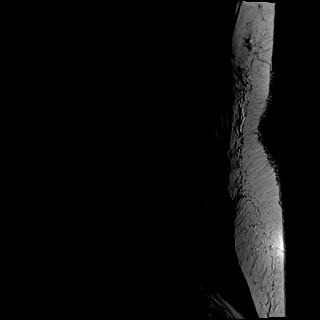
[im 12/15]
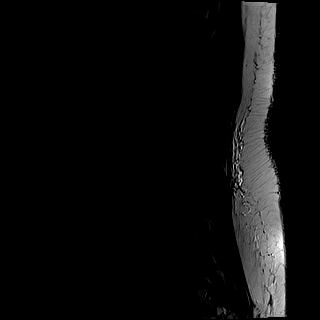
[im 15/15]
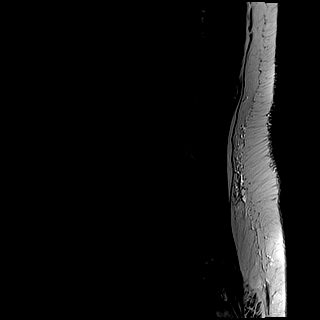

[Series 4: T1 · sagittal · 4.0mm · 0.81mm/px · 6 of 15 slices shown (1 of 2)]
[im 1/15]
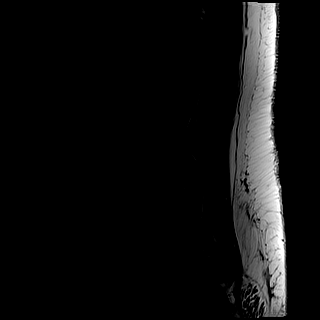
[im 3/15]
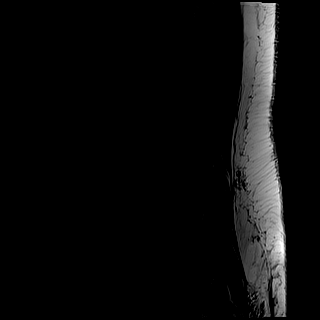
[im 6/15]
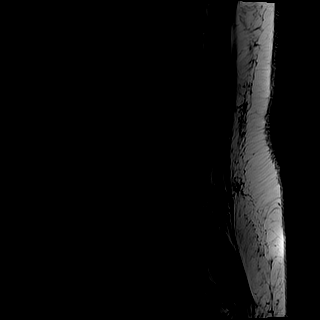
[im 9/15]
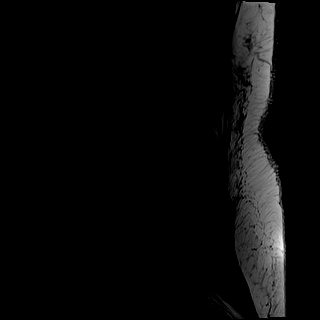
[im 12/15]
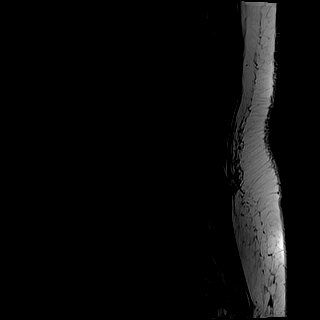
[im 15/15]
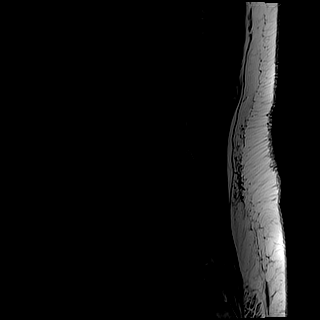

[Series 5: STIR · sagittal · 4.0mm · 1.02mm/px · 6 of 15 slices shown]
[im 1/15]
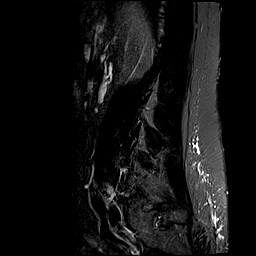
[im 3/15]
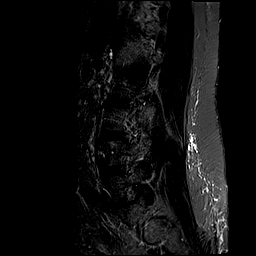
[im 6/15]
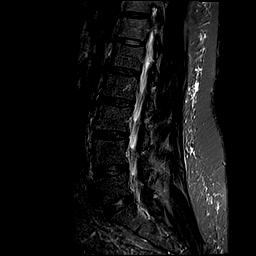
[im 9/15]
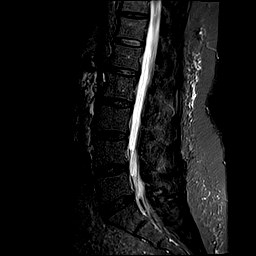
[im 12/15]
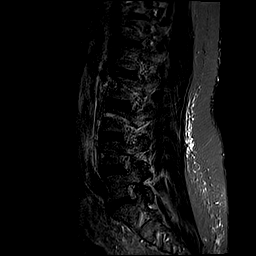
[im 15/15]
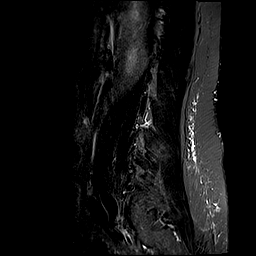

[Series 6: T2 · axial · 4.0mm · 0.78mm/px · z∈[-61,+149]mm · 12 of 39 slices shown (2 of 2)]
[im 1/39]
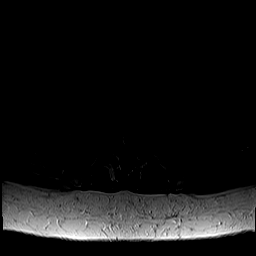
[im 3/39]
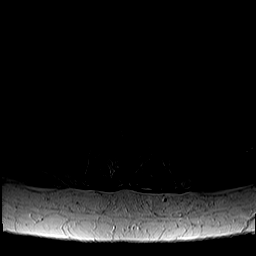
[im 6/39]
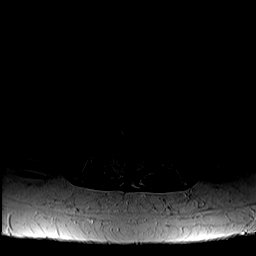
[im 9/39]
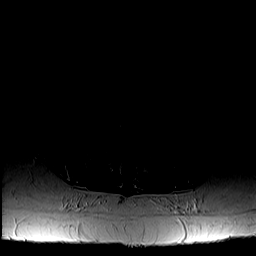
[im 11/39]
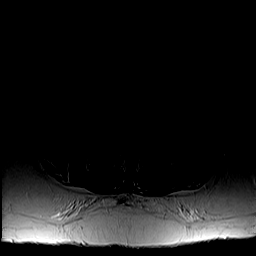
[im 14/39]
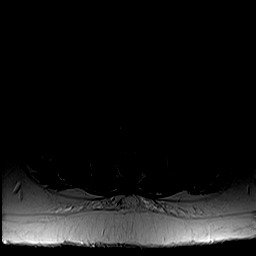
[im 17/39]
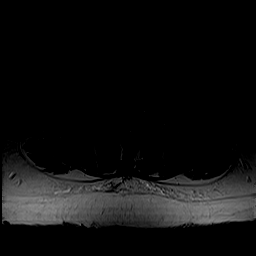
[im 20/39]
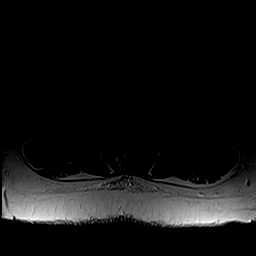
[im 22/39]
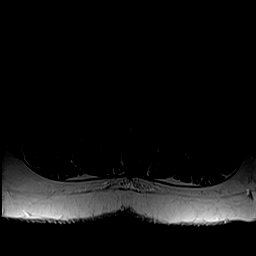
[im 28/39]
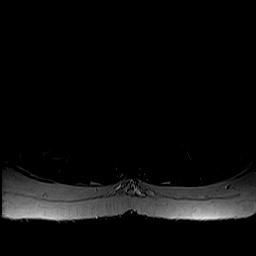
[im 33/39]
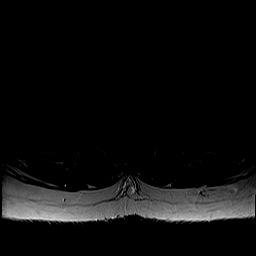
[im 39/39]
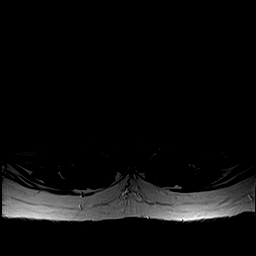

[Series 7: T1 · axial · 4.0mm · 0.39mm/px · z∈[-61,+149]mm · 9 of 39 slices shown (2 of 2)]
[im 1/39]
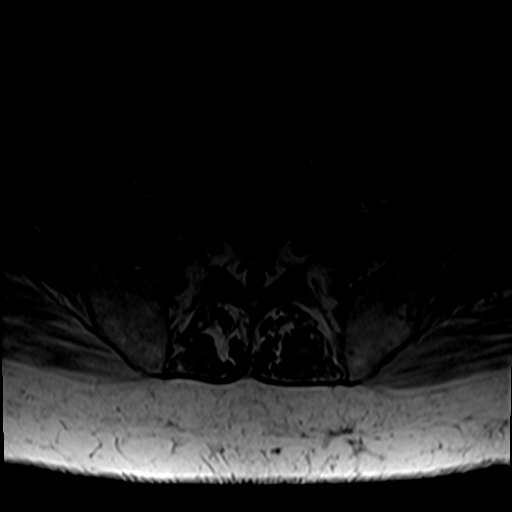
[im 6/39]
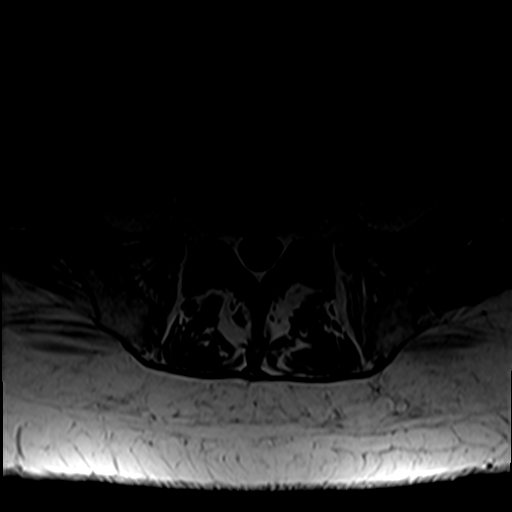
[im 11/39]
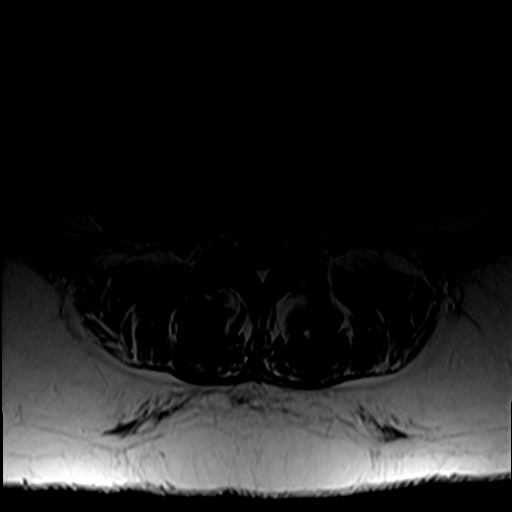
[im 17/39]
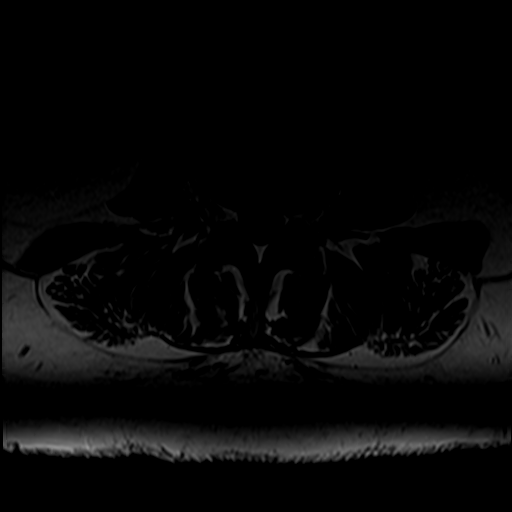
[im 20/39]
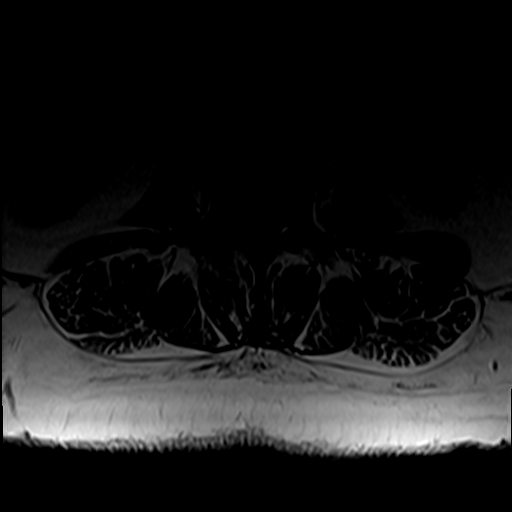
[im 22/39]
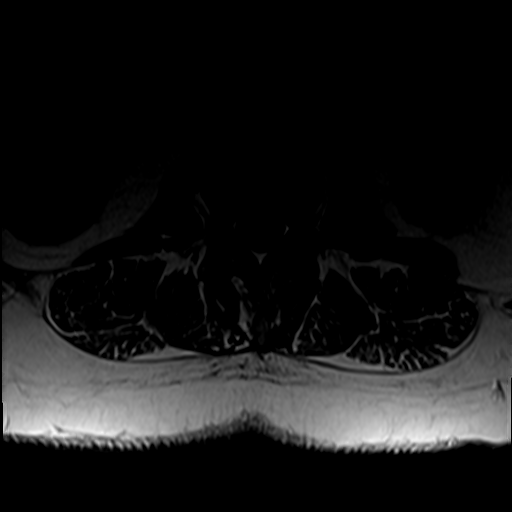
[im 28/39]
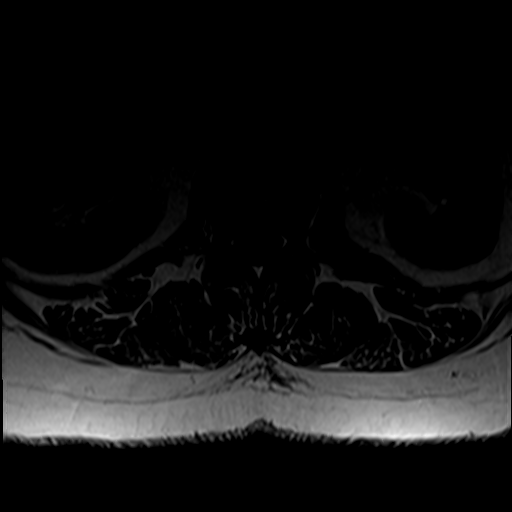
[im 33/39]
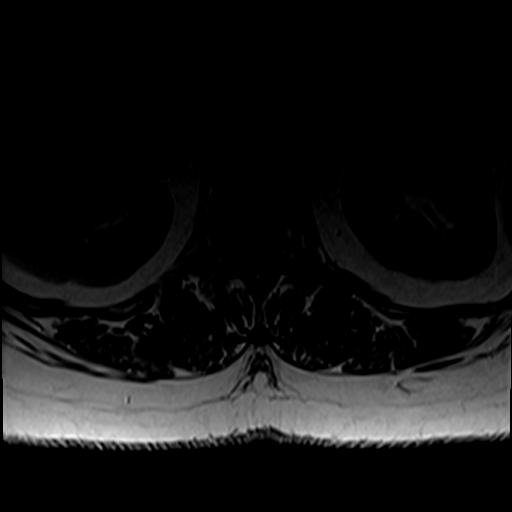
[im 39/39]
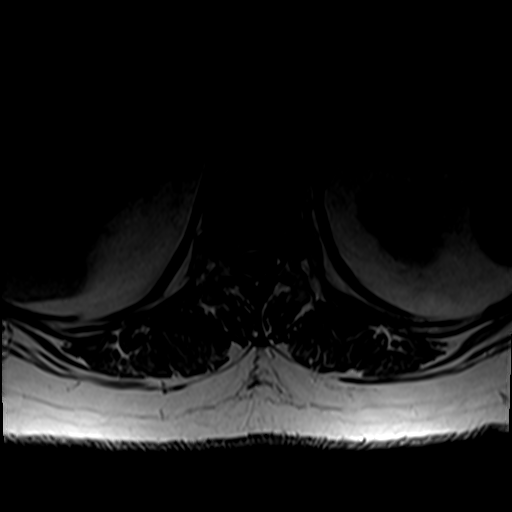

[39 of 48 positions shown; findings below may reference images not displayed]

FINDINGS: Vertebral body height and alignment are maintained. A few small
hemangiomas are identified. No worrisome marrow lesion is present.
The conus medullaris is normal in signal and position. Imaged
intra-abdominal contents are unremarkable.

T11-12 is imaged in the sagittal plane only and negative.

T12-L1:  Negative.

L1-2:  Negative.

L2-3:  Negative.

L3-4:  Mild facet degenerative change.  Otherwise negative.

L4-5:  Mild facet degenerative change.  Otherwise negative.

L5-S1:  Mild facet degenerative change.  Otherwise negative.
IMPRESSION: Negative for central canal or foraminal narrowing. Mild facet
degenerative changes seen in the lower lumbar spine. The study is
otherwise unremarkable.

## 2021-09-01 DEATH — deceased
